# Patient Record
Sex: Male | Born: 1990
Health system: Southern US, Community
[De-identification: ages and names within clinical notes are randomized; demographics above are authoritative.]

## PROBLEM LIST (undated history)

## (undated) DIAGNOSIS — T1491XA Suicide attempt, initial encounter: Secondary | ICD-10-CM

## (undated) DIAGNOSIS — F209 Schizophrenia, unspecified: Secondary | ICD-10-CM

## (undated) HISTORY — PX: NO PAST SURGERIES: SHX2092

---
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
## 2017-06-17 DIAGNOSIS — R112 Nausea with vomiting, unspecified: Secondary | ICD-10-CM | POA: Diagnosis not present

## 2017-07-25 DIAGNOSIS — J00 Acute nasopharyngitis [common cold]: Secondary | ICD-10-CM | POA: Diagnosis not present

## 2018-01-09 DIAGNOSIS — Z7189 Other specified counseling: Secondary | ICD-10-CM | POA: Diagnosis not present

## 2018-02-24 ENCOUNTER — Other Ambulatory Visit: Payer: Self-pay

## 2018-02-24 ENCOUNTER — Emergency Department: Payer: Medicare Other

## 2018-02-24 ENCOUNTER — Inpatient Hospital Stay
Admission: EM | Admit: 2018-02-24 | Discharge: 2018-03-01 | DRG: 917 | Disposition: A | Discharge status: Other Acute Inpatient Hospital | Payer: Medicare Other | Attending: Internal Medicine | Admitting: Internal Medicine

## 2018-02-24 DIAGNOSIS — F209 Schizophrenia, unspecified: Secondary | ICD-10-CM | POA: Diagnosis present

## 2018-02-24 DIAGNOSIS — F17210 Nicotine dependence, cigarettes, uncomplicated: Secondary | ICD-10-CM | POA: Diagnosis present

## 2018-02-24 DIAGNOSIS — T434X2A Poisoning by butyrophenone and thiothixene neuroleptics, intentional self-harm, initial encounter: Principal | ICD-10-CM | POA: Diagnosis present

## 2018-02-24 DIAGNOSIS — W19XXXA Unspecified fall, initial encounter: Secondary | ICD-10-CM | POA: Diagnosis present

## 2018-02-24 DIAGNOSIS — F1510 Other stimulant abuse, uncomplicated: Secondary | ICD-10-CM | POA: Diagnosis present

## 2018-02-24 DIAGNOSIS — R05 Cough: Secondary | ICD-10-CM | POA: Diagnosis not present

## 2018-02-24 DIAGNOSIS — T50901A Poisoning by unspecified drugs, medicaments and biological substances, accidental (unintentional), initial encounter: Secondary | ICD-10-CM | POA: Diagnosis not present

## 2018-02-24 DIAGNOSIS — F329 Major depressive disorder, single episode, unspecified: Secondary | ICD-10-CM | POA: Diagnosis present

## 2018-02-24 DIAGNOSIS — E876 Hypokalemia: Secondary | ICD-10-CM | POA: Diagnosis present

## 2018-02-24 DIAGNOSIS — R9431 Abnormal electrocardiogram [ECG] [EKG]: Secondary | ICD-10-CM

## 2018-02-24 DIAGNOSIS — Z4682 Encounter for fitting and adjustment of non-vascular catheter: Secondary | ICD-10-CM | POA: Diagnosis not present

## 2018-02-24 DIAGNOSIS — Z79899 Other long term (current) drug therapy: Secondary | ICD-10-CM | POA: Diagnosis not present

## 2018-02-24 DIAGNOSIS — S025XXA Fracture of tooth (traumatic), initial encounter for closed fracture: Secondary | ICD-10-CM | POA: Diagnosis not present

## 2018-02-24 DIAGNOSIS — T1491XA Suicide attempt, initial encounter: Secondary | ICD-10-CM | POA: Diagnosis not present

## 2018-02-24 DIAGNOSIS — Y92009 Unspecified place in unspecified non-institutional (private) residence as the place of occurrence of the external cause: Secondary | ICD-10-CM | POA: Diagnosis not present

## 2018-02-24 DIAGNOSIS — J9601 Acute respiratory failure with hypoxia: Secondary | ICD-10-CM | POA: Diagnosis not present

## 2018-02-24 DIAGNOSIS — R0602 Shortness of breath: Secondary | ICD-10-CM | POA: Diagnosis not present

## 2018-02-24 DIAGNOSIS — T434X5A Adverse effect of butyrophenone and thiothixene neuroleptics, initial encounter: Secondary | ICD-10-CM | POA: Diagnosis present

## 2018-02-24 DIAGNOSIS — I10 Essential (primary) hypertension: Secondary | ICD-10-CM | POA: Diagnosis not present

## 2018-02-24 DIAGNOSIS — T50902A Poisoning by unspecified drugs, medicaments and biological substances, intentional self-harm, initial encounter: Secondary | ICD-10-CM | POA: Diagnosis not present

## 2018-02-24 DIAGNOSIS — I4581 Long QT syndrome: Secondary | ICD-10-CM | POA: Diagnosis present

## 2018-02-24 DIAGNOSIS — J209 Acute bronchitis, unspecified: Secondary | ICD-10-CM | POA: Diagnosis not present

## 2018-02-24 DIAGNOSIS — Z915 Personal history of self-harm: Secondary | ICD-10-CM | POA: Diagnosis not present

## 2018-02-24 DIAGNOSIS — T43592A Poisoning by other antipsychotics and neuroleptics, intentional self-harm, initial encounter: Secondary | ICD-10-CM | POA: Diagnosis present

## 2018-02-24 DIAGNOSIS — F200 Paranoid schizophrenia: Secondary | ICD-10-CM | POA: Diagnosis not present

## 2018-02-24 DIAGNOSIS — S199XXA Unspecified injury of neck, initial encounter: Secondary | ICD-10-CM | POA: Diagnosis not present

## 2018-02-24 DIAGNOSIS — R0600 Dyspnea, unspecified: Secondary | ICD-10-CM

## 2018-02-24 DIAGNOSIS — Z4659 Encounter for fitting and adjustment of other gastrointestinal appliance and device: Secondary | ICD-10-CM

## 2018-02-24 DIAGNOSIS — S0993XA Unspecified injury of face, initial encounter: Secondary | ICD-10-CM | POA: Diagnosis not present

## 2018-02-24 DIAGNOSIS — F1210 Cannabis abuse, uncomplicated: Secondary | ICD-10-CM | POA: Diagnosis present

## 2018-02-24 DIAGNOSIS — J690 Pneumonitis due to inhalation of food and vomit: Secondary | ICD-10-CM | POA: Diagnosis not present

## 2018-02-24 DIAGNOSIS — J9600 Acute respiratory failure, unspecified whether with hypoxia or hypercapnia: Secondary | ICD-10-CM

## 2018-02-24 DIAGNOSIS — J90 Pleural effusion, not elsewhere classified: Secondary | ICD-10-CM | POA: Diagnosis not present

## 2018-02-24 DIAGNOSIS — Z630 Problems in relationship with spouse or partner: Secondary | ICD-10-CM | POA: Diagnosis not present

## 2018-02-24 DIAGNOSIS — S0990XA Unspecified injury of head, initial encounter: Secondary | ICD-10-CM | POA: Diagnosis not present

## 2018-02-24 DIAGNOSIS — T50904A Poisoning by unspecified drugs, medicaments and biological substances, undetermined, initial encounter: Secondary | ICD-10-CM | POA: Diagnosis not present

## 2018-02-24 HISTORY — DX: Suicide attempt, initial encounter: T14.91XA

## 2018-02-24 LAB — COMPREHENSIVE METABOLIC PANEL
ALT: 16 U/L (ref 0–44)
AST: 17 U/L (ref 15–41)
Albumin: 4.7 g/dL (ref 3.5–5.0)
Alkaline Phosphatase: 56 U/L (ref 38–126)
Anion gap: 12 (ref 5–15)
BUN: 9 mg/dL (ref 6–20)
CO2: 24 mmol/L (ref 22–32)
Calcium: 9.3 mg/dL (ref 8.9–10.3)
Chloride: 101 mmol/L (ref 98–111)
Creatinine, Ser: 1.04 mg/dL (ref 0.61–1.24)
GFR calc Af Amer: >60 mL/min (ref >60)
GFR calc non Af Amer: >60 mL/min (ref >60)
Glucose, Bld: 187 mg/dL — ABNORMAL HIGH (ref 70–99)
Potassium: 3.2 mmol/L — ABNORMAL LOW (ref 3.5–5.1)
Sodium: 137 mmol/L (ref 135–145)
Total Bilirubin: 0.8 mg/dL (ref 0.3–1.2)
Total Protein: 7.7 g/dL (ref 6.5–8.1)

## 2018-02-24 LAB — CBC WITH DIFFERENTIAL/PLATELET
Basophils Absolute: 0 10*3/uL (ref 0–0.1)
Basophils Relative: 0 %
Eosinophils Absolute: 0.2 10*3/uL (ref 0–0.7)
Eosinophils Relative: 1 %
HCT: 45.8 % (ref 40.0–52.0)
Hemoglobin: 16.4 g/dL (ref 13.0–18.0)
Lymphocytes Relative: 9 %
Lymphs Abs: 1 10*3/uL (ref 1.0–3.6)
MCH: 31.9 pg (ref 26.0–34.0)
MCHC: 35.8 g/dL (ref 32.0–36.0)
MCV: 89 fL (ref 80.0–100.0)
Monocytes Absolute: 0.6 10*3/uL (ref 0.2–1.0)
Monocytes Relative: 5 %
Neutro Abs: 9.5 10*3/uL — ABNORMAL HIGH (ref 1.4–6.5)
Neutrophils Relative %: 85 %
Platelets: 267 10*3/uL (ref 150–440)
RBC: 5.15 MIL/uL (ref 4.40–5.90)
RDW: 13 % (ref 11.5–14.5)
WBC: 11.3 10*3/uL — ABNORMAL HIGH (ref 3.8–10.6)

## 2018-02-24 LAB — ETHANOL: Alcohol, Ethyl (B): <10 mg/dL (ref <10)

## 2018-02-24 LAB — ACETAMINOPHEN LEVEL: Acetaminophen (Tylenol), Serum: <10 ug/mL — ABNORMAL LOW (ref 10–30)

## 2018-02-24 LAB — TROPONIN I: Troponin I: <0.03 ng/mL (ref <0.03)

## 2018-02-24 LAB — LIPASE, BLOOD: Lipase: 20 U/L (ref 11–51)

## 2018-02-24 LAB — SALICYLATE LEVEL: Salicylate Lvl: <7 mg/dL (ref 2.8–30.0)

## 2018-02-24 MED ORDER — MAGNESIUM SULFATE 2 GM/50ML IV SOLN
2.0000 g | Freq: Once | INTRAVENOUS | Status: AC
  Administered 2018-02-25: 2 g via INTRAVENOUS
  Filled 2018-02-24: qty 50

## 2018-02-24 MED ORDER — KETAMINE HCL 10 MG/ML IJ SOLN
100.0000 mg | Freq: Once | INTRAMUSCULAR | Status: AC
  Administered 2018-02-25: 100 mg via INTRAVENOUS

## 2018-02-24 MED ORDER — ROCURONIUM BROMIDE 50 MG/5ML IV SOLN
100.0000 mg | Freq: Once | INTRAVENOUS | Status: AC
  Administered 2018-02-25: 100 mg via INTRAVENOUS
  Filled 2018-02-24: qty 10

## 2018-02-24 MED ORDER — KETAMINE HCL 50 MG/ML IJ SOLN
INTRAMUSCULAR | Status: AC
  Administered 2018-02-25: 100 mg
  Filled 2018-02-24: qty 10

## 2018-02-24 MED ORDER — KETAMINE HCL 10 MG/ML IJ SOLN: 100.0000 mg | Freq: Once | INTRAMUSCULAR | Status: DC

## 2018-02-24 MED ORDER — SODIUM CHLORIDE 0.9 % IV BOLUS
1000.0000 mL | Freq: Once | INTRAVENOUS | Status: AC
  Administered 2018-02-25: 1000 mL via INTRAVENOUS

## 2018-02-24 MED ORDER — MIDAZOLAM HCL 5 MG/5ML IJ SOLN
INTRAMUSCULAR | Status: AC
  Administered 2018-02-24: 2 mg
  Filled 2018-02-24: qty 5

## 2018-02-24 MED ORDER — DEXMEDETOMIDINE HCL IN NACL 400 MCG/100ML IV SOLN
0.4000 ug/kg/h | INTRAVENOUS | Status: DC
  Administered 2018-02-25: 0.4 ug/kg/h via INTRAVENOUS
  Filled 2018-02-24: qty 100

## 2018-02-24 MED ORDER — FENTANYL 2500MCG IN NS 250ML (10MCG/ML) PREMIX INFUSION
150.0000 ug/h | INTRAVENOUS | Status: DC
  Administered 2018-02-25: 150 ug/h via INTRAVENOUS
  Filled 2018-02-24: qty 250

## 2018-02-24 NOTE — ED Notes (Addendum)
Attempted to help pt get straightened out in the bed, explained to pt that he was sitting straight up in the bed, pt proceeds to call this NT a "stupid bitch" and say "god damn I just want to straighten up".

## 2018-02-24 NOTE — ED Provider Notes (Signed)
[facility] Emergency Department Provider Note  ____________________________________________   First MD Initiated Contact with Patient 02/24/18 2304     (approximate)  I have reviewed the triage vital signs and the nursing notes.   HISTORY  Chief Complaint Ingestion  Level 5 caveat:  history/ROS limited by active psychosis / mental illness / altered mental status   HPI Phu Record is a 27 y.o. male who is both somnolent and belligerent and minimally cooperative with the history.  He arrives by EMS after reportedly taking an overdose of at least 20 tablets of haloperidol and 30 tablets of Abilify.  He says that he wants to die.  After taking the overdose he allegedly fell when he was outside and struck his face on the ground or concrete and he has at least one broken tooth and some dried blood in his mouth.  He will not answer questions about headache or neck pain.  He reports that he needs to have a bowel movement and keeps yelling and screaming at everyone that he needs "to take a shit" in between episodes of falling asleep.  No additional history is available at this time other than the patient's persistent suicidality and the fact that he says he attempted to kill himself several years ago and spent time in a psychiatric facility.  Past Medical History:  Diagnosis Date  . Suicide attempt (HCC)     There are no active problems to display for this patient.   History reviewed. No pertinent surgical history.  Prior to Admission medications   Medication Sig Start Date End Date Taking? Authorizing Provider  ARIPiprazole (ABILIFY) 15 MG tablet Take 15 mg by mouth daily. 02/14/18  Yes [provider]  divalproex (DEPAKOTE) 500 MG DR tablet Take 500 mg by mouth 2 (two) times daily. 02/14/18  Yes [provider]  doxepin (SINEQUAN) 50 MG capsule Take 50 mg by mouth at bedtime.  02/11/18  Yes [provider]  haloperidol (HALDOL) 10 MG  tablet Take 10 mg by mouth 2 (two) times daily. 02/14/18  Yes [provider]  hydrOXYzine (VISTARIL) 25 MG capsule Take 25 mg by mouth every 8 (eight) hours as needed.  02/14/18  Yes [provider]    Allergies Patient has no known allergies.  History reviewed. No pertinent family history.  Social History Social History   Tobacco Use  . Smoking status: Current Every Day Smoker    Packs/day: 1.00  . Smokeless tobacco: Current User  Substance Use Topics  . Alcohol use: Not Currently  . Drug use: Yes    Types: Methamphetamines    Review of Systems Level 5 caveat:  history/ROS limited by active psychosis / mental illness / altered mental status  ____________________________________________   PHYSICAL EXAM:  VITAL SIGNS: ED Triage Vitals  Enc Vitals Group     BP 02/24/18 2222 (!) 144/90     Pulse Rate 02/24/18 2222 77     Resp 02/24/18 2222 16     Temp 02/24/18 2222 (!) 97.5 F (36.4 C)     Temp Source 02/24/18 2222 Oral     SpO2 02/24/18 2222 94 %     Weight 02/24/18 2223 106.6 kg (235 lb)     Height 02/24/18 2223 1.88 m (6\' 2" )     Head Circumference --      Peak Flow --      Pain Score 02/24/18 2223 0     Pain Loc --  Pain Edu? --      Excl. in GC? --     Constitutional: Intermittently somnolent and belligerent Eyes: Conjunctivae are normal.  Pupils are equal but sluggish Head: Atraumatic except for mouth as described below Ears: No hemotympanum Nose: Dried epistaxis in both nares Mouth/Throat: Mucous membranes are moist.  There is some dental trauma but no laceration that requires suturing; he has a fractured tooth #9 but no other teeth feel loose at this time. Neck: No stridor.  No meningeal signs.  Patient is uncooperative with physical exam so I do not know if he has any cervical spine tenderness Cardiovascular: Normal rate, regular rhythm. Good peripheral circulation. Grossly normal heart sounds. Respiratory: Normal respiratory  effort.  No retractions. Lungs CTAB.  Frequent thick sounding cough. Gastrointestinal: Soft and nontender. No distention.  Musculoskeletal: No lower extremity tenderness nor edema. No gross deformities of extremities. Neurologic:  Normal speech and language. No gross focal neurologic deficits are appreciated.  Skin:  Skin is warm, dry and intact. No rash noted. Psychiatric: Mood and affect are alternating between somnolent, belligerent, but throughout he continues to endorse suicidal ideation and states that the overdose was an attempt to kill himself.  ____________________________________________   LABS (all labs ordered are listed, but only abnormal results are displayed)  Labs Reviewed  ACETAMINOPHEN LEVEL - Abnormal; Notable for the following components:      Result Value   Acetaminophen (Tylenol), Serum <10 (*)    All other components within normal limits  COMPREHENSIVE METABOLIC PANEL - Abnormal; Notable for the following components:   Potassium 3.2 (*)    Glucose, Bld 187 (*)    All other components within normal limits  CBC WITH DIFFERENTIAL/PLATELET - Abnormal; Notable for the following components:   WBC 11.3 (*)    Neutro Abs 9.5 (*)    All other components within normal limits  URINE DRUG SCREEN, QUALITATIVE ([facility] ONLY) - Abnormal; Notable for the following components:   Tricyclic, Ur Screen POSITIVE (*)    Amphetamines, Ur Screen POSITIVE (*)    MDMA (Ecstasy)Ur Screen POSITIVE (*)    Cannabinoid 50 Ng, Ur [place] POSITIVE (*)    Benzodiazepine, Ur Scrn POSITIVE (*)    All other components within normal limits  URINALYSIS, COMPLETE (UACMP) WITH MICROSCOPIC - Abnormal; Notable for the following components:   Color, Urine YELLOW (*)    APPearance HAZY (*)    Ketones, ur 20 (*)    Bacteria, UA RARE (*)    All other components within normal limits  BLOOD GAS, ARTERIAL - Abnormal; Notable for the following components:   pH, Arterial 7.32 (*)    pO2, Arterial 74 (*)     Acid-base deficit 4.4 (*)    All other components within normal limits  VALPROIC ACID LEVEL - Abnormal; Notable for the following components:   Valproic Acid Lvl <10 (*)    All other components within normal limits  GLUCOSE, CAPILLARY - Abnormal; Notable for the following components:   Glucose-Capillary 123 (*)    All other components within normal limits  CBC - Abnormal; Notable for the following components:   RBC 4.25 (*)    HCT 37.8 (*)    All other components within normal limits  BASIC METABOLIC PANEL - Abnormal; Notable for the following components:   Glucose, Bld 126 (*)    Calcium 7.8 (*)    Anion gap 4 (*)    All other components within normal limits  BLOOD GAS, ARTERIAL - Abnormal; Notable  for the following components:   pH, Arterial 7.32 (*)    pO2, Arterial 198 (*)    Acid-base deficit 2.3 (*)    All other components within normal limits  MRSA PCR SCREENING  ETHANOL  LIPASE, BLOOD  SALICYLATE LEVEL  TROPONIN I  MAGNESIUM  TRIGLYCERIDES  MAGNESIUM  GLUCOSE, CAPILLARY  HIV ANTIBODY (ROUTINE TESTING)  PHOSPHORUS  CBG MONITORING, ED   ____________________________________________  EKG  ED ECG REPORT I, Loleta Rose, the attending physician, personally viewed and interpreted this ECG.  Date: 02/24/2018 EKG Time: 23:25 Rate: 84 Rhythm: normal sinus rhythm QRS Axis: normal Intervals: RBBB.  Concerning for QTc prolongation at 562 ms ST/T Wave abnormalities: Non-specific ST segment / T-wave changes, but no evidence of acute ischemia. Narrative Interpretation: no evidence of acute ischemia   ____________________________________________  RADIOLOGY I, Loleta Rose, personally viewed and evaluated these images (plain radiographs) as part of my medical decision making, as well as reviewing the written report by the radiologist.  ED MD interpretation: No active disease process on first chest x-ray.  Second chest x-ray indicates appropriate placement of ET tube.   No acute abnormalities on CT scans of the face, head, and cervical spine.  Official radiology report(s): Ct Head Wo Contrast  Result Date: 02/25/2018 CLINICAL DATA:  Head trauma, intracranial venous injury suspected Altered level of consciousness (LOC), unexplained; Facial trauma, fx suspected took overdose then fell and hit face on concrete; C-spine fx, traumatic. EXAM: CT HEAD WITHOUT CONTRAST CT MAXILLOFACIAL WITHOUT CONTRAST CT CERVICAL SPINE WITHOUT CONTRAST TECHNIQUE: Multidetector CT imaging of the head, cervical spine, and maxillofacial structures were performed using the standard protocol without intravenous contrast. Multiplanar CT image reconstructions of the cervical spine and maxillofacial structures were also generated. COMPARISON:  None. FINDINGS: CT HEAD FINDINGS Brain: No evidence of cerebral edema. No intracranial hemorrhage, mass effect, or midline shift. No hydrocephalus. The basilar cisterns are patent. No evidence of territorial infarct or acute ischemia. No extra-axial or intracranial fluid collection. Vascular: No hyperdense vessel or unexpected calcification. Skull: No fracture or focal lesion. Other: None. CT MAXILLOFACIAL FINDINGS Osseous: Nasal bone, zygomatic arches, and mandibles are intact. The temporomandibular joints are congruent. Dental caries with periapical lucency about left upper second molar. Orbits: Both orbits and globes are intact.  No orbital fracture. Sinuses: Mucosal thickening throughout the paranasal sinuses with opacification of ethmoid air cells. No sinus fluid levels or fracture. Enteric and endotracheal tubes in place. Soft tissues: Negative. CT CERVICAL SPINE FINDINGS Alignment: Slight left head tilt. Otherwise normal without traumatic subluxation. Skull base and vertebrae: No acute fracture. Vertebral body heights are maintained. The dens and skull base are intact. Soft tissues and spinal canal: No prevertebral fluid or swelling. No visible canal hematoma.  Disc levels:  Normal. Upper chest: Negative. Other: None. IMPRESSION: 1. Negative head CT. 2. No facial bone fracture. Mucosal thickening of the paranasal sinuses may be secondary to intubation. Dental caries with periapical lucency of left upper second molar. 3. Negative CT of the cervical spine. Electronically Signed   By: Narda Rutherford M.D.   On: 02/25/2018 02:23   Ct Cervical Spine Wo Contrast  Result Date: 02/25/2018 CLINICAL DATA:  Head trauma, intracranial venous injury suspected Altered level of consciousness (LOC), unexplained; Facial trauma, fx suspected took overdose then fell and hit face on concrete; C-spine fx, traumatic. EXAM: CT HEAD WITHOUT CONTRAST CT MAXILLOFACIAL WITHOUT CONTRAST CT CERVICAL SPINE WITHOUT CONTRAST TECHNIQUE: Multidetector CT imaging of the head, cervical spine, and maxillofacial structures were performed  using the standard protocol without intravenous contrast. Multiplanar CT image reconstructions of the cervical spine and maxillofacial structures were also generated. COMPARISON:  None. FINDINGS: CT HEAD FINDINGS Brain: No evidence of cerebral edema. No intracranial hemorrhage, mass effect, or midline shift. No hydrocephalus. The basilar cisterns are patent. No evidence of territorial infarct or acute ischemia. No extra-axial or intracranial fluid collection. Vascular: No hyperdense vessel or unexpected calcification. Skull: No fracture or focal lesion. Other: None. CT MAXILLOFACIAL FINDINGS Osseous: Nasal bone, zygomatic arches, and mandibles are intact. The temporomandibular joints are congruent. Dental caries with periapical lucency about left upper second molar. Orbits: Both orbits and globes are intact.  No orbital fracture. Sinuses: Mucosal thickening throughout the paranasal sinuses with opacification of ethmoid air cells. No sinus fluid levels or fracture. Enteric and endotracheal tubes in place. Soft tissues: Negative. CT CERVICAL SPINE FINDINGS Alignment: Slight  left head tilt. Otherwise normal without traumatic subluxation. Skull base and vertebrae: No acute fracture. Vertebral body heights are maintained. The dens and skull base are intact. Soft tissues and spinal canal: No prevertebral fluid or swelling. No visible canal hematoma. Disc levels:  Normal. Upper chest: Negative. Other: None. IMPRESSION: 1. Negative head CT. 2. No facial bone fracture. Mucosal thickening of the paranasal sinuses may be secondary to intubation. Dental caries with periapical lucency of left upper second molar. 3. Negative CT of the cervical spine. Electronically Signed   By: Narda Rutherford M.D.   On: 02/25/2018 02:23   Dg Chest Portable 1 View  Result Date: 02/25/2018 CLINICAL DATA:  27 y/o  M; status post intubation. EXAM: PORTABLE CHEST 1 VIEW COMPARISON:  02/24/2018 chest radiograph FINDINGS: Stable normal cardiac silhouette given projection and technique. Endotracheal tube tip projects 3.1 cm above the carina. Enteric tube tip extends below the field of view into the abdomen. Clear lungs. No pleural effusion or pneumothorax. No acute osseous abnormality is evident. IMPRESSION: 1. Endotracheal tube tip projects 3.1 cm above the carina. Enteric tube tip is below field of view in the abdomen. 2. No acute pulmonary process identified. Electronically Signed   By: Mitzi Hansen M.D.   On: 02/25/2018 01:01   Dg Chest Portable 1 View  Result Date: 02/25/2018 CLINICAL DATA:  Cough and short of breath overdose EXAM: PORTABLE CHEST 1 VIEW COMPARISON:  None. FINDINGS: Borderline heart size. No acute opacity or pleural effusion. No pneumothorax. IMPRESSION: No active disease. Electronically Signed   By: Jasmine Pang M.D.   On: 02/25/2018 00:05   Ct Maxillofacial Wo Contrast  Result Date: 02/25/2018 CLINICAL DATA:  Head trauma, intracranial venous injury suspected Altered level of consciousness (LOC), unexplained; Facial trauma, fx suspected took overdose then fell and hit face  on concrete; C-spine fx, traumatic. EXAM: CT HEAD WITHOUT CONTRAST CT MAXILLOFACIAL WITHOUT CONTRAST CT CERVICAL SPINE WITHOUT CONTRAST TECHNIQUE: Multidetector CT imaging of the head, cervical spine, and maxillofacial structures were performed using the standard protocol without intravenous contrast. Multiplanar CT image reconstructions of the cervical spine and maxillofacial structures were also generated. COMPARISON:  None. FINDINGS: CT HEAD FINDINGS Brain: No evidence of cerebral edema. No intracranial hemorrhage, mass effect, or midline shift. No hydrocephalus. The basilar cisterns are patent. No evidence of territorial infarct or acute ischemia. No extra-axial or intracranial fluid collection. Vascular: No hyperdense vessel or unexpected calcification. Skull: No fracture or focal lesion. Other: None. CT MAXILLOFACIAL FINDINGS Osseous: Nasal bone, zygomatic arches, and mandibles are intact. The temporomandibular joints are congruent. Dental caries with periapical lucency about left upper  second molar. Orbits: Both orbits and globes are intact.  No orbital fracture. Sinuses: Mucosal thickening throughout the paranasal sinuses with opacification of ethmoid air cells. No sinus fluid levels or fracture. Enteric and endotracheal tubes in place. Soft tissues: Negative. CT CERVICAL SPINE FINDINGS Alignment: Slight left head tilt. Otherwise normal without traumatic subluxation. Skull base and vertebrae: No acute fracture. Vertebral body heights are maintained. The dens and skull base are intact. Soft tissues and spinal canal: No prevertebral fluid or swelling. No visible canal hematoma. Disc levels:  Normal. Upper chest: Negative. Other: None. IMPRESSION: 1. Negative head CT. 2. No facial bone fracture. Mucosal thickening of the paranasal sinuses may be secondary to intubation. Dental caries with periapical lucency of left upper second molar. 3. Negative CT of the cervical spine. Electronically Signed   By: Narda Rutherford M.D.   On: 02/25/2018 02:23    ____________________________________________   PROCEDURES  Critical Care performed: Yes, see critical care procedure note(s)   Procedure(s) performed:   .Critical Care Performed by: Loleta Rose, MD Authorized by: Loleta Rose, MD   Critical care provider statement:    Critical care time (minutes):  60   Critical care time was exclusive of:  Separately billable procedures and treating other patients   Critical care was necessary to treat or prevent imminent or life-threatening deterioration of the following conditions:  Toxidrome and respiratory failure (suicide attempt)   Critical care was time spent personally by me on the following activities:  Development of treatment plan with patient or surrogate, discussions with consultants, evaluation of patient's response to treatment, examination of patient, obtaining history from patient or surrogate, ordering and performing treatments and interventions, ordering and review of laboratory studies, ordering and review of radiographic studies, pulse oximetry, re-evaluation of patient's condition and review of old charts Procedure Name: Intubation Date/Time: 02/25/2018 12:14 AM Performed by: Loleta Rose, MD Pre-anesthesia Checklist: Patient identified, Emergency Drugs available, Suction available and Patient being monitored Preoxygenation: Pre-oxygenation with 100% oxygen Induction Type: IV induction and Rapid sequence Laryngoscope Size: Glidescope Tube size: 7.5 mm Number of attempts: 1 Placement Confirmation: ETT inserted through vocal cords under direct vision,  CO2 detector and Breath sounds checked- equal and bilateral Secured at: 23 cm Tube secured with: ETT holder Dental Injury: Teeth and Oropharynx as per pre-operative assessment  Comments: Patient already had partially avulsed tooth #9 with some dried blood in mouth prior to intubation.  Procedure was atraumatic, accomplished easily and  without excessive manipulation, and no other damage/injury was sustained during the intubation that was not pre-existing.        ____________________________________________   INITIAL IMPRESSION / ASSESSMENT AND PLAN / ED COURSE  As part of my medical decision making, I reviewed the following data within the electronic MEDICAL RECORD NUMBER Nursing notes reviewed and incorporated, Labs reviewed , EKG interpreted , Old chart reviewed, Radiograph reviewed  and Discussed with admitting physician (Dr. Anne Hahn)    Differential diagnosis includes, but is not limited to, depression, suicidal ideation, mood disorder, adjustment disorder, intoxication.  The sequela of his particular overdose includes QTC prolongation, CNS depression, potentially fatal cardiac arrhythmias, etc.  The patient required a great deal of care for at least an hour after my arrival to my shift.  As described above, he alternated from minute to minute if not second the second between belligerent and agitated, ripping out his IV, and trying to leave, with being somnolent while vomiting.  I attempted to different calming agents including Versed 2 mg  IV which had no apparent effect as well as ketamine 1 mg IV also has a calming agent through which she was monitored on a cardiac monitor and SPO2.  Within minutes he was again awake and attempting to pull out his IV.  In the meantime his chest x-ray was clear but his EKG demonstrates a QTC of greater than 560 ms and I have no old EKG against which to compare.  This is very concerning in the setting of an overdose of both Abilify and Haldol.  Additionally his labs came back showing a potassium of 3 which can worsen his potential for cardiac arrhythmias.  I have ordered potassium infusion as well as magnesium 2 g IV to treat the overdose and EKG changes.  For his altered mental status, concern for loss of airway, and danger posed to both the patient and staff (at one point he drew back his  fist as of to punch me when I was attempting to verbally de-escalate him), I made the decision to intubate using ketamine 100 mg IV, rocuronium 100 mg IV, and then started him on a Precedex infusion and fentanyl infusion (150 mcg/h).  He is also receiving a 1 L fluid bolus.  I discussed the case with the hospitalist, Dr. Anne Hahn, who will admit to the ICU for further management.  Of note I did place the patient under involuntary commitment due to his persistent suicidal ideation and he will need to be seen by psychiatry after he proves he is able to protect his airway.   Clinical Course as of Feb 26 800  Tue Feb 25, 2018  0118 Patient increasingly hypoxemic.  Increased FiO2 to 100% and PEEP from 5-8 given the concern for possible ARDS.   [CF]  0118 Urine drug screen is positive for multiple substances; we provided benzodiazepines and ketamine so I do not know for sure that these were positive previously, but he also tested positive for tricyclics, MDMA, and cannabinoids.  Urine Drug Screen, Qualitative(!) [CF]  8242 No acute abnormalities on CTs (head, maxillofacial, and C-spine)   [CF]    Clinical Course User Index [CF] Loleta Rose, MD    ____________________________________________  FINAL CLINICAL IMPRESSION(S) / ED DIAGNOSES  Final diagnoses:  Haloperidol overdose, intentional self-harm, initial encounter (HCC)  Suicide attempt (HCC)  Acute respiratory failure, unspecified whether with hypoxia or hypercapnia (HCC)  Prolonged Q-T interval on ECG  Hypokalemia     MEDICATIONS GIVEN DURING THIS VISIT:  Medications  magnesium sulfate IVPB 2 g 50 mL (2 g Intravenous New Bag/Given 02/25/18 0012)  dexmedetomidine (PRECEDEX) 400 MCG/100ML (4 mcg/mL) infusion (has no administration in time range)  sodium chloride 0.9 % bolus 1,000 mL (1,000 mLs Intravenous New Bag/Given 02/25/18 0025)  fentaNYL in NS (30mcg/ml) infusion-PREMIX (has no administration in time range)    potassium chloride 10 mEq in 100 mL IVPB (has no administration in time range)  midazolam (VERSED) 5 MG/5ML injection (2 mg  Given 02/24/18 2309)  ketamine (KETALAR) injection 100 mg (100 mg Intravenous Given by Other 02/25/18 0007)  ketamine (KETALAR) 50 MG/ML injection (100 mg  Given 02/25/18 0007)  rocuronium (ZEMURON) injection 100 mg (100 mg Intravenous Given 02/25/18 0009)     ED Discharge Orders    None       Note:  This document was prepared using Dragon voice recognition software and may include unintentional dictation errors.    Loleta Rose, MD 02/25/18 [phone]

## 2018-02-24 NOTE — ED Notes (Signed)
Pt reports pain to the whole body. Pt is argumentative. Pt wants the covers placed over his shoulders. Encourgaed pt to do it himself. Pt states he is unable to feel anything.

## 2018-02-24 NOTE — ED Triage Notes (Signed)
Pt to the er for an intentional overdose. Per EMS pt took 30 Haldol and 20 Abilify. Vital signs stable enroute. Pt not cooperative on truck. Pt nauseated and vomiting at this time. Pt fell and did knock out a tooth prior to EMS arrival.

## 2018-02-25 ENCOUNTER — Inpatient Hospital Stay: Payer: Medicare Other

## 2018-02-25 ENCOUNTER — Emergency Department: Payer: Medicare Other

## 2018-02-25 ENCOUNTER — Encounter: Payer: Self-pay | Admitting: Internal Medicine

## 2018-02-25 DIAGNOSIS — S025XXA Fracture of tooth (traumatic), initial encounter for closed fracture: Secondary | ICD-10-CM | POA: Diagnosis present

## 2018-02-25 DIAGNOSIS — T50901A Poisoning by unspecified drugs, medicaments and biological substances, accidental (unintentional), initial encounter: Secondary | ICD-10-CM

## 2018-02-25 DIAGNOSIS — J9601 Acute respiratory failure with hypoxia: Secondary | ICD-10-CM | POA: Diagnosis present

## 2018-02-25 DIAGNOSIS — T43592A Poisoning by other antipsychotics and neuroleptics, intentional self-harm, initial encounter: Secondary | ICD-10-CM | POA: Diagnosis present

## 2018-02-25 DIAGNOSIS — T434X5A Adverse effect of butyrophenone and thiothixene neuroleptics, initial encounter: Secondary | ICD-10-CM | POA: Diagnosis present

## 2018-02-25 DIAGNOSIS — Z915 Personal history of self-harm: Secondary | ICD-10-CM | POA: Diagnosis not present

## 2018-02-25 DIAGNOSIS — T434X2A Poisoning by butyrophenone and thiothixene neuroleptics, intentional self-harm, initial encounter: Secondary | ICD-10-CM | POA: Diagnosis present

## 2018-02-25 DIAGNOSIS — F329 Major depressive disorder, single episode, unspecified: Secondary | ICD-10-CM | POA: Diagnosis present

## 2018-02-25 DIAGNOSIS — F209 Schizophrenia, unspecified: Secondary | ICD-10-CM | POA: Diagnosis present

## 2018-02-25 DIAGNOSIS — W19XXXA Unspecified fall, initial encounter: Secondary | ICD-10-CM | POA: Diagnosis present

## 2018-02-25 DIAGNOSIS — F1210 Cannabis abuse, uncomplicated: Secondary | ICD-10-CM | POA: Diagnosis present

## 2018-02-25 DIAGNOSIS — T50902A Poisoning by unspecified drugs, medicaments and biological substances, intentional self-harm, initial encounter: Secondary | ICD-10-CM | POA: Diagnosis not present

## 2018-02-25 DIAGNOSIS — Z630 Problems in relationship with spouse or partner: Secondary | ICD-10-CM | POA: Diagnosis not present

## 2018-02-25 DIAGNOSIS — I10 Essential (primary) hypertension: Secondary | ICD-10-CM | POA: Diagnosis not present

## 2018-02-25 DIAGNOSIS — F17210 Nicotine dependence, cigarettes, uncomplicated: Secondary | ICD-10-CM | POA: Diagnosis present

## 2018-02-25 DIAGNOSIS — Y92009 Unspecified place in unspecified non-institutional (private) residence as the place of occurrence of the external cause: Secondary | ICD-10-CM | POA: Diagnosis not present

## 2018-02-25 DIAGNOSIS — Z79899 Other long term (current) drug therapy: Secondary | ICD-10-CM | POA: Diagnosis not present

## 2018-02-25 DIAGNOSIS — J690 Pneumonitis due to inhalation of food and vomit: Secondary | ICD-10-CM | POA: Diagnosis not present

## 2018-02-25 DIAGNOSIS — J209 Acute bronchitis, unspecified: Secondary | ICD-10-CM | POA: Diagnosis present

## 2018-02-25 DIAGNOSIS — E876 Hypokalemia: Secondary | ICD-10-CM | POA: Diagnosis present

## 2018-02-25 DIAGNOSIS — F200 Paranoid schizophrenia: Secondary | ICD-10-CM | POA: Diagnosis not present

## 2018-02-25 DIAGNOSIS — F1510 Other stimulant abuse, uncomplicated: Secondary | ICD-10-CM | POA: Diagnosis present

## 2018-02-25 DIAGNOSIS — T1491XA Suicide attempt, initial encounter: Secondary | ICD-10-CM

## 2018-02-25 DIAGNOSIS — I4581 Long QT syndrome: Secondary | ICD-10-CM | POA: Diagnosis present

## 2018-02-25 LAB — POTASSIUM
Potassium: 5 mmol/L (ref 3.5–5.1)
Potassium: 4.6 mmol/L (ref 3.5–5.1)

## 2018-02-25 LAB — BASIC METABOLIC PANEL
Anion gap: 4 — ABNORMAL LOW (ref 5–15)
BUN: 7 mg/dL (ref 6–20)
CO2: 24 mmol/L (ref 22–32)
Calcium: 7.8 mg/dL — ABNORMAL LOW (ref 8.9–10.3)
Chloride: 108 mmol/L (ref 98–111)
Creatinine, Ser: 0.76 mg/dL (ref 0.61–1.24)
GFR calc Af Amer: >60 mL/min (ref >60)
GFR calc non Af Amer: >60 mL/min (ref >60)
Glucose, Bld: 126 mg/dL — ABNORMAL HIGH (ref 70–99)
Potassium: 4.2 mmol/L (ref 3.5–5.1)
Sodium: 136 mmol/L (ref 135–145)

## 2018-02-25 LAB — GLUCOSE, CAPILLARY
Glucose-Capillary: 123 mg/dL — ABNORMAL HIGH (ref 70–99)
Glucose-Capillary: 86 mg/dL (ref 70–99)
Glucose-Capillary: 111 mg/dL — ABNORMAL HIGH (ref 70–99)
Glucose-Capillary: 66 mg/dL — ABNORMAL LOW (ref 70–99)
Glucose-Capillary: 99 mg/dL (ref 70–99)
Glucose-Capillary: 157 mg/dL — ABNORMAL HIGH (ref 70–99)

## 2018-02-25 LAB — URINE DRUG SCREEN, QUALITATIVE (ARMC ONLY)
Amphetamines, Ur Screen: POSITIVE — AB
Barbiturates, Ur Screen: NOT DETECTED
Benzodiazepine, Ur Scrn: POSITIVE — AB
Cannabinoid 50 Ng, Ur ~~LOC~~: POSITIVE — AB
Cocaine Metabolite,Ur ~~LOC~~: NOT DETECTED
MDMA (Ecstasy)Ur Screen: POSITIVE — AB
Methadone Scn, Ur: NOT DETECTED
Opiate, Ur Screen: NOT DETECTED
Phencyclidine (PCP) Ur S: NOT DETECTED
Tricyclic, Ur Screen: POSITIVE — AB

## 2018-02-25 LAB — BLOOD GAS, ARTERIAL
Acid-base deficit: 2.3 mmol/L — ABNORMAL HIGH (ref 0.0–2.0)
Acid-base deficit: 4.4 mmol/L — ABNORMAL HIGH (ref 0.0–2.0)
Bicarbonate: 21.6 mmol/L (ref 20.0–28.0)
Bicarbonate: 24.2 mmol/L (ref 20.0–28.0)
FIO2: 0.4
FIO2: 1
MECHVT: 500 mL
MECHVT: 500 mL
Mechanical Rate: 12
Mechanical Rate: 20
O2 Saturation: 93.3 %
O2 Saturation: 99.6 %
PEEP: 10 cmH2O
PEEP: 5 cmH2O
Patient temperature: 37
Patient temperature: 37
pCO2 arterial: 42 mmHg (ref 32.0–48.0)
pCO2 arterial: 47 mmHg (ref 32.0–48.0)
pH, Arterial: 7.32 — ABNORMAL LOW (ref 7.350–7.450)
pH, Arterial: 7.32 — ABNORMAL LOW (ref 7.350–7.450)
pO2, Arterial: 198 mmHg — ABNORMAL HIGH (ref 83.0–108.0)
pO2, Arterial: 74 mmHg — ABNORMAL LOW (ref 83.0–108.0)

## 2018-02-25 LAB — CBC
HCT: 37.8 % — ABNORMAL LOW (ref 40.0–52.0)
Hemoglobin: 13.6 g/dL (ref 13.0–18.0)
MCH: 32 pg (ref 26.0–34.0)
MCHC: 36 g/dL (ref 32.0–36.0)
MCV: 89.1 fL (ref 80.0–100.0)
Platelets: 238 10*3/uL (ref 150–440)
RBC: 4.25 MIL/uL — ABNORMAL LOW (ref 4.40–5.90)
RDW: 12.8 % (ref 11.5–14.5)
WBC: 8 10*3/uL (ref 3.8–10.6)

## 2018-02-25 LAB — URINALYSIS, COMPLETE (UACMP) WITH MICROSCOPIC
Bilirubin Urine: NEGATIVE
Glucose, UA: NEGATIVE mg/dL
Hgb urine dipstick: NEGATIVE
Ketones, ur: 20 mg/dL — AB
Leukocytes, UA: NEGATIVE
Nitrite: NEGATIVE
Protein, ur: NEGATIVE mg/dL
Specific Gravity, Urine: 1.013 (ref 1.005–1.030)
Squamous Epithelial / LPF: NONE SEEN (ref 0–5)
pH: 6 (ref 5.0–8.0)

## 2018-02-25 LAB — VALPROIC ACID LEVEL: Valproic Acid Lvl: <10 ug/mL — ABNORMAL LOW (ref 50.0–100.0)

## 2018-02-25 LAB — MAGNESIUM
Magnesium: 1.8 mg/dL (ref 1.7–2.4)
Magnesium: 2 mg/dL (ref 1.7–2.4)

## 2018-02-25 LAB — PHOSPHORUS: Phosphorus: 4 mg/dL (ref 2.5–4.6)

## 2018-02-25 LAB — TRIGLYCERIDES: Triglycerides: 93 mg/dL (ref <150)

## 2018-02-25 LAB — MRSA PCR SCREENING: MRSA by PCR: NEGATIVE

## 2018-02-25 MED ORDER — FENTANYL 2500MCG IN NS 250ML (10MCG/ML) PREMIX INFUSION
25.0000 ug/h | INTRAVENOUS | Status: DC
  Administered 2018-02-25: 250 ug/h via INTRAVENOUS
  Administered 2018-02-25 – 2018-02-26 (×2): 350 ug/h via INTRAVENOUS
  Filled 2018-02-25 (×3): qty 250

## 2018-02-25 MED ORDER — POTASSIUM CHLORIDE 10 MEQ/100ML IV SOLN
10.0000 meq | INTRAVENOUS | Status: DC
  Administered 2018-02-25 (×4): 10 meq via INTRAVENOUS
  Filled 2018-02-25 (×7): qty 100

## 2018-02-25 MED ORDER — INSULIN ASPART 100 UNIT/ML ~~LOC~~ SOLN
0.0000 [IU] | SUBCUTANEOUS | Status: DC
  Administered 2018-02-25: 3 [IU] via SUBCUTANEOUS
  Administered 2018-02-25 – 2018-02-26 (×2): 2 [IU] via SUBCUTANEOUS
  Filled 2018-02-25 (×4): qty 1

## 2018-02-25 MED ORDER — VITAL HIGH PROTEIN PO LIQD
1000.0000 mL | ORAL | Status: DC
  Administered 2018-02-25: 1000 mL

## 2018-02-25 MED ORDER — ASPIRIN 300 MG RE SUPP
300.0000 mg | RECTAL | Status: AC
  Administered 2018-02-25: 300 mg via RECTAL
  Filled 2018-02-25: qty 1

## 2018-02-25 MED ORDER — PROPOFOL 1000 MG/100ML IV EMUL
5.0000 ug/kg/min | INTRAVENOUS | Status: DC
  Administered 2018-02-25: 35 ug/kg/min via INTRAVENOUS
  Administered 2018-02-25: 15 ug/kg/min via INTRAVENOUS
  Administered 2018-02-25 – 2018-02-26 (×2): 35 ug/kg/min via INTRAVENOUS
  Administered 2018-02-26: 40 ug/kg/min via INTRAVENOUS
  Filled 2018-02-25 (×6): qty 100

## 2018-02-25 MED ORDER — DEXTROSE-NACL 5-0.45 % IV SOLN
INTRAVENOUS | Status: DC
  Administered 2018-02-25 – 2018-02-26 (×2): via INTRAVENOUS

## 2018-02-25 MED ORDER — METHYLPREDNISOLONE SODIUM SUCC 125 MG IJ SOLR
60.0000 mg | Freq: Four times a day (QID) | INTRAMUSCULAR | Status: DC
  Administered 2018-02-25 – 2018-02-26 (×5): 60 mg via INTRAVENOUS
  Filled 2018-02-25 (×5): qty 2

## 2018-02-25 MED ORDER — POTASSIUM CHLORIDE 10 MEQ/100ML IV SOLN
10.0000 meq | INTRAVENOUS | Status: DC
  Filled 2018-02-25 (×2): qty 100

## 2018-02-25 MED ORDER — ENOXAPARIN SODIUM 40 MG/0.4ML ~~LOC~~ SOLN
40.0000 mg | SUBCUTANEOUS | Status: DC
  Administered 2018-02-25: 40 mg via SUBCUTANEOUS
  Filled 2018-02-25: qty 0.4

## 2018-02-25 MED ORDER — ADULT MULTIVITAMIN LIQUID CH
15.0000 mL | Freq: Every day | ORAL | Status: DC
  Administered 2018-02-25: 15 mL
  Filled 2018-02-25 (×2): qty 15

## 2018-02-25 MED ORDER — MIDAZOLAM HCL 2 MG/2ML IJ SOLN
2.0000 mg | INTRAMUSCULAR | Status: DC | PRN
  Administered 2018-02-25: 2 mg via INTRAVENOUS
  Filled 2018-02-25 (×2): qty 2

## 2018-02-25 MED ORDER — DEXTROSE 50 % IV SOLN
INTRAVENOUS | Status: AC
  Administered 2018-02-25: 50 mL via INTRAVENOUS
  Filled 2018-02-25: qty 50

## 2018-02-25 MED ORDER — IPRATROPIUM BROMIDE 0.02 % IN SOLN
0.5000 mg | Freq: Four times a day (QID) | RESPIRATORY_TRACT | Status: DC
  Administered 2018-02-25 – 2018-02-26 (×4): 0.5 mg via RESPIRATORY_TRACT
  Filled 2018-02-25 (×4): qty 2.5

## 2018-02-25 MED ORDER — PANTOPRAZOLE SODIUM 40 MG IV SOLR
40.0000 mg | Freq: Two times a day (BID) | INTRAVENOUS | Status: DC
  Administered 2018-02-25 – 2018-02-27 (×5): 40 mg via INTRAVENOUS
  Filled 2018-02-25 (×5): qty 40

## 2018-02-25 MED ORDER — FENTANYL CITRATE (PF) 100 MCG/2ML IJ SOLN: 50.0000 ug | Freq: Once | INTRAMUSCULAR | Status: DC

## 2018-02-25 MED ORDER — SODIUM CHLORIDE 0.9 % IV SOLN
INTRAVENOUS | Status: DC
  Administered 2018-02-25 (×2): via INTRAVENOUS

## 2018-02-25 MED ORDER — MIDAZOLAM HCL 5 MG/5ML IJ SOLN
5.0000 mg | Freq: Once | INTRAMUSCULAR | Status: AC
  Administered 2018-02-25: 5 mg via INTRAVENOUS

## 2018-02-25 MED ORDER — ORAL CARE MOUTH RINSE
15.0000 mL | OROMUCOSAL | Status: DC
  Administered 2018-02-25 – 2018-02-26 (×8): 15 mL via OROMUCOSAL

## 2018-02-25 MED ORDER — SODIUM CHLORIDE 0.9 % IV SOLN
250.0000 mL | INTRAVENOUS | Status: DC | PRN
  Administered 2018-02-25: 250 mL via INTRAVENOUS

## 2018-02-25 MED ORDER — ENOXAPARIN SODIUM 40 MG/0.4ML ~~LOC~~ SOLN
40.0000 mg | SUBCUTANEOUS | Status: DC
  Administered 2018-02-25 – 2018-02-28 (×4): 40 mg via SUBCUTANEOUS
  Filled 2018-02-25 (×4): qty 0.4

## 2018-02-25 MED ORDER — SUCRALFATE 1 GM/10ML PO SUSP
1.0000 g | Freq: Four times a day (QID) | ORAL | Status: DC
  Filled 2018-02-25: qty 10

## 2018-02-25 MED ORDER — DEXTROSE 50 % IV SOLN
50.0000 mL | INTRAVENOUS | Status: AC
  Administered 2018-02-25: 50 mL via INTRAVENOUS

## 2018-02-25 MED ORDER — ASPIRIN 81 MG PO CHEW: 324.0000 mg | CHEWABLE_TABLET | ORAL | Status: AC

## 2018-02-25 MED ORDER — PRO-STAT SUGAR FREE PO LIQD
60.0000 mL | Freq: Four times a day (QID) | ORAL | Status: DC
  Administered 2018-02-25 – 2018-02-26 (×3): 60 mL

## 2018-02-25 MED ORDER — FENTANYL BOLUS VIA INFUSION
50.0000 ug | INTRAVENOUS | Status: DC | PRN
  Filled 2018-02-25: qty 50

## 2018-02-25 MED ORDER — MIDAZOLAM HCL 2 MG/2ML IJ SOLN
2.0000 mg | INTRAMUSCULAR | Status: DC | PRN
  Administered 2018-02-25 – 2018-02-26 (×2): 2 mg via INTRAVENOUS
  Filled 2018-02-25 (×2): qty 2

## 2018-02-25 MED ORDER — CHLORHEXIDINE GLUCONATE 0.12% ORAL RINSE (MEDLINE KIT)
15.0000 mL | Freq: Two times a day (BID) | OROMUCOSAL | Status: DC
  Administered 2018-02-25 (×2): 15 mL via OROMUCOSAL

## 2018-02-25 MED ORDER — VECURONIUM BROMIDE 10 MG IV SOLR
10.0000 mg | Freq: Once | INTRAVENOUS | Status: AC
  Administered 2018-02-25: 10 mg via INTRAVENOUS
  Filled 2018-02-25: qty 10

## 2018-02-25 NOTE — ED Notes (Signed)
Pt is desatting to 89 %. MD aware. Resp paged.

## 2018-02-25 NOTE — Progress Notes (Signed)
Pharmacy Electrolyte Monitoring Consult:  Pharmacy consulted to assist in monitoring and replacing electrolytes in this 27 y.o. male admitted on 02/24/2018 with Ingestion   Labs:  Sodium (mmol/L)  Date Value  02/25/2018 136   Potassium (mmol/L)  Date Value  02/25/2018 4.2   Magnesium (mg/dL)  Date Value  31/51/7616 2.0   Calcium (mg/dL)  Date Value  07/37/1062 7.8 (L)   Albumin (g/dL)  Date Value  69/48/5462 4.7    Assessment/Plan: 09/10 @ 0300 electrolytes WNL. Will check stat phos. Will recheck electrolytes w/ am labs.  Thomasene Ripple, PharmD, BCPS Clinical Pharmacist 02/25/2018

## 2018-02-25 NOTE — Consult Note (Signed)
PULMONARY / CRITICAL CARE MEDICINE   Name: Scott Velasquez MRN: [id number] DOB: [birth year]    ADMISSION DATE:  02/24/2018 CONSULTATION DATE:  02/25/2018  REFERRING MD:  Dr. Jannifer Franklin  CHIEF COMPLAINT:  Drug overdose / suicide attempt  HISTORY OF PRESENT ILLNESS:   Scott Velasquez is a 27 y.o. Male with a PMH of previous suicide attempts who presents to [facility] ED on 02/24/18 after reportedly taking at least 20 tablets of Haldol and 30 tablets of Abilify.  He was found outside his home after falling and allegedly striking his head on the ground.  He told EMS that he had taken high doses of Haldol and Abilify, and that this was an attempt to kill himself.    In the ED, pt's mental status fluctuated between somnolence and belligerence to staff.  He was subsequently intubated for airway protection.  Initial workup in the ED revealed EKG with prolonged QTc of 562 ms, no evidence of acute ischemia.  Urine drug screen is positive for Amphetamines, Benzodiazepines, MDMA, Cannabis, and Tricyclic antidepressants.  CXR with no concern for acute cardiopulmonary disease.  Serum Potassium 3.2, Troponin negative, WBC 11.3, Acetaminophen <34, and Salicylate <7.  Pt is admitted for intentional drug overdose (suicide attempt) of Haldol and Abilify requiring intubation for airway protection.  PCCM is consulted for further management.  PAST MEDICAL HISTORY :  He  has a past medical history of Suicide attempt (Scott Velasquez).  PAST SURGICAL HISTORY: He  has a past surgical history that includes No past surgeries.  No Known Allergies  No current facility-administered medications on file prior to encounter.    Current Outpatient Medications on File Prior to Encounter  Medication Sig  . ARIPiprazole (ABILIFY) 15 MG tablet Take 15 mg by mouth daily.  . divalproex (DEPAKOTE) 500 MG DR tablet Take 500 mg by mouth 2 (two) times daily.  Marland Kitchen doxepin (SINEQUAN) 50 MG capsule Take 50 mg by mouth at bedtime.   . haloperidol (HALDOL) 10 MG tablet Take  10 mg by mouth 2 (two) times daily.  . hydrOXYzine (VISTARIL) 25 MG capsule Take 25 mg by mouth every 8 (eight) hours as needed.     FAMILY HISTORY:  His has no family status information on file.    SOCIAL HISTORY: He  reports that he has been smoking. He has been smoking about 1.00 pack per day. He uses smokeless tobacco. He reports that he drank alcohol. He reports that he has current or past drug history. Drug: Methamphetamines.  REVIEW OF SYSTEMS:   Unable to obtain due to intubation and sedation  SUBJECTIVE:  Unable to obtain due to intubation and sedation  VITAL SIGNS: BP 128/88   Pulse 71   Temp (!) 97.5 F (36.4 C) (Oral)   Resp 19   Ht 6' 2"  (1.88 m)   Wt 106.6 kg   SpO2 100%   BMI 30.17 kg/m   HEMODYNAMICS:    VENTILATOR SETTINGS:    INTAKE / OUTPUT: No intake/output data recorded.  PHYSICAL EXAMINATION: General:  Acutely ill appearing male, laying in bed, intubated, in NAD Neuro:  Sedated, withdraws from pain, cough reflex present, Pupils PERRL 1 mm sluggish bilaterally HEENT:  Normocephalic, Atraumatic, neck supple, no JVD Cardiovascular:  Bradycardia, normal rhythm, s1s2, no M/R/G Lungs:  Coarse breath sounds bilaterally with slight expiratory wheezing, even, nonlabored, vent assisted Abdomen:  Soft, nontender, nondistended, BS+ x4 Musculoskeletal:  Normal bulk and tone, no deformities, 2+ pulses throughout Skin:  Warm/dry.  No obvious rashes, lesions, or  ulcerations  LABS:  BMET Recent Labs  Lab 02/24/18 2229  NA 137  K 3.2*  CL 101  CO2 24  BUN 9  CREATININE 1.04  GLUCOSE 187*    Electrolytes Recent Labs  Lab 02/24/18 2229  CALCIUM 9.3  MG 1.8    CBC Recent Labs  Lab 02/24/18 2229  WBC 11.3*  HGB 16.4  HCT 45.8  PLT 267    Coag's No results for input(s): APTT, INR in the last 168 hours.  Sepsis Markers No results for input(s): LATICACIDVEN, PROCALCITON, O2SATVEN in the last 168 hours.  ABG Recent Labs  Lab  02/25/18 0035  PHART 7.32*  PCO2ART 42  PO2ART 74*    Liver Enzymes Recent Labs  Lab 02/24/18 2229  AST 17  ALT 16  ALKPHOS 56  BILITOT 0.8  ALBUMIN 4.7    Cardiac Enzymes Recent Labs  Lab 02/24/18 2229  TROPONINI <0.03    Glucose No results for input(s): GLUCAP in the last 168 hours.  Imaging Dg Chest Portable 1 View  Result Date: 02/25/2018 CLINICAL DATA:  Cough and short of breath overdose EXAM: PORTABLE CHEST 1 VIEW COMPARISON:  None. FINDINGS: Borderline heart size. No acute opacity or pleural effusion. No pneumothorax. IMPRESSION: No active disease. Electronically Signed   By: Donavan Foil M.D.   On: 02/25/2018 00:05    STUDIES:  CT Cervical Spine wo contrast 02/25/18>> Negative CT Head wo contrast 02/25/18>> Negative CT Maxillofacial wo contrast 02/25/18>> No facial bone fracture. Mucosal thickening of the paranasal sinuses may be secondary to intubation. Dental caries with periapical lucency of left upper second molar. UDS 02/24/18>> Positive for Amphetamines, Benzo's, MDMA, Cannaboid, Tricyclics  UA 5/0/09>> negative  CULTURES:  ANTIBIOTICS:  SIGNIFICANT EVENTS: 02/25/18>>Admission to [facility] ICU  LINES/TUBES: ETT 02/24/18>>  DISCUSSION: 27 y.o. Male admitted for intentional drug overdose / suicide attempt with Haldol & Abilify requiring intubation for airway protection.  Pt also with QTc prolongation on EKG.  ASSESSMENT / PLAN:  PULMONARY A: Intubated for airway protection in setting of drug overdose P:   Full vent support, PRVC: 8 cc/kg Wean FiO2 & PEEP as tolerated SBT when parameters met VAP Bundle Pulmonary hygiene Follow intermittent ABG & CXR Prn Bronchodilators  CARDIOVASCULAR A:  Prolonged QTc on EKG in setting of Haldol & Abilify overdose P:  ICU monitoring Maintain MAP >65 Follow EKG q3h If QRS widens, will need Na Bicarb Will keep Serum K & Magnesium at upper normal limits (pharmacy consulted for assistance with electrolyte  replacement) Gentle IVF: NS @ 100 ml/hr  RENAL A:   Hypokalemia P:   Monitor I&O's / urinary output Follow BMP Ensure adequate renal perfusion Avoid nephrotoxic agents as able Replace electrolytes as indicated Potassium and Magnesium repleted in ED, serum f/u in am 9/10 Per Poison Control, keep K and Mag at upper normal limits Gentle IVF: NS @ 100 ml/hr   GASTROINTESTINAL A:   No active issues P:   NPO Will hold off on Pepcid for SUP at this time given prolonged QTc  HEMATOLOGIC A:   No active issues P:  Monitor for s/sx of bleeding Trend CBC Lovenox SQ for VTE Prophylaxis Transfuse for Hgb<7.0  INFECTIOUS A:   Mild Leukocytosis, no obvious source of infection P:   Monitor fever curve Trend WBC's  ENDOCRINE A:   Hyperglycemia   P:   CBG's SSI Follow ICU Hypo/hyperglycemia protocol  NEUROLOGIC A:   Sedation needs in setting of Mechanical ventilation Drug overdose (suicide attempt) w/  Haldol & Abilify P:   RASS goal: 0 to -1 Propofol gtt & Prn Versed pushes to maintain RASS goal Daily WUA Avoid sedating meds as able Provide supportive care Lights on during the day Psych consulted, appreciate input Involuntarily committed until cleared by [facility] Control following, appreciate input, will follow recommendations Obtain Valproic acid level   FAMILY  - Updates: No family currently at bedside for update 9/10 during NP rounds.  - Inter-disciplinary family meet or Palliative Care meeting due by:  03/03/2018    Darel Hong, AGACNP-BC [facility] Pager: [phone]   02/25/2018, 12:58 AM

## 2018-02-25 NOTE — Progress Notes (Signed)
Patients mother at bedside giving some patient history, she states patient is schizophrenic and does not take his medications as prescribed, depakote and abilify. He is usually verbally threatening towards her, "ill will kill you." She does not want him to return back home with her and his grandmother does not either. He was currently living with his grandmother.

## 2018-02-25 NOTE — ED Notes (Signed)
Pt to the er for an overdose. Pt says his friend called 911. Pt says he took a bunch of pills before going to his friends house. Pt is sleeping in between answers and his argumentative when he arouses. Pt begins to yell "I need to take a shit." Pt placed on bed pan for safety. Pt reports hurting all over but not being able to feel anything. Pt is spitting into a bag. VSS at this time.

## 2018-02-25 NOTE — ED Provider Notes (Signed)
Angiocath insertion Performed by: Merrily Brittle  Consent: Verbal consent obtained. Risks and benefits: risks, benefits and alternatives were discussed Time out: Immediately prior to procedure a "time out" was called to verify the correct patient, procedure, equipment, support staff and site/side marked as required.  Preparation: Patient was prepped and draped in the usual sterile fashion.  Vein Location: left ej  Gauge: 18  Normal blood return and flush without difficulty Patient tolerance: Patient tolerated the procedure well with no immediate complications.      Merrily Brittle, MD 02/25/18 [phone]

## 2018-02-25 NOTE — ED Notes (Signed)
Respiratory at bedside to adjust vent settings to aid in ventilation

## 2018-02-25 NOTE — Progress Notes (Signed)
Initial Nutrition Assessment  DOCUMENTATION CODES:   Obesity unspecified  INTERVENTION:  Initiate Vital High Protein at 20 mL/hr (480 mL goal daily volume) + Pro-Stat 60 mL QID via OGT. Provides 1280 kcal, 162 grams of protein, 403 mL H2O daily. With current propofol rate provides 1871 kcal daily.  Provide liquid MVI daily per tube as goal TF regimen does not meet 100% RDIs for vitamins/minerals.  As patient is receiving NS @ 50 mL/hr and D5-1/2NS at 50 mL/hr, provide minimum free water flush of 30 mL Q4hrs to maintain tube patency.  NUTRITION DIAGNOSIS:   Inadequate oral intake related to inability to eat as evidenced by NPO status.  GOAL:   Provide needs based on ASPEN/SCCM guidelines  MONITOR:   Vent status, Labs, Weight trends, TF tolerance, I & O's  REASON FOR ASSESSMENT:   Ventilator    ASSESSMENT:   27 year old male with PMHx of previous suicide attempts admitted for intentional drug overdose (suicide attempt) with Haldol and Abilify requiring intubation on 9/10 for airway protection.   Patient intubated and sedated. On PRVC mode with FiO2 60% and PEEP 10 cmH2O. No family members present at time of RD assessment. No weight history in chart to trend.  Access: 16 Fr. OGT placed 9/10; terminates in stomach per abdominal x-ray today; marked at corner of mouth  MAP: 69-92 mmHg  Patient is currently intubated on ventilator support Ve: 9.7 L/min Temp (24hrs), Avg:97.9 F (36.6 C), Min:97.5 F (36.4 C), Max:98.9 F (37.2 C)  Propofol: 22.4 mL/hr (591 kcal daily)  Medications reviewed and include: Novolog 0-15 units Q4hrs, Solu-Medrol 60 mg Q6hrs IV, pantoprazole, NS @ 50 mL/hr, D5-1/2NS at 50 mL/hr (60 grams dextrose, 204 kcal daily), fentanyl gtt, propofol gtt.  Labs reviewed: CBG 66-111.  Patient does not meet criteria for malnutrition.  Discussed with RN and in rounds. RD notified later in the afternoon that plan is actually to go ahead and start tube feeds  today.  NUTRITION - FOCUSED PHYSICAL EXAM:    Most Recent Value  Orbital Region  No depletion  Upper Arm Region  No depletion  Thoracic and Lumbar Region  No depletion  Buccal Region  Unable to assess  Temple Region  No depletion  Clavicle Bone Region  No depletion  Clavicle and Acromion Bone Region  No depletion  Scapular Bone Region  Unable to assess  Dorsal Hand  No depletion  Patellar Region  No depletion  Anterior Thigh Region  No depletion  Posterior Calf Region  No depletion  Edema (RD Assessment)  None  Hair  Reviewed  Eyes  Unable to assess  Mouth  Unable to assess  Skin  Reviewed  Nails  Reviewed     Diet Order:   Diet Order            Diet NPO time specified  Diet effective now              EDUCATION NEEDS:   No education needs have been identified at this time  Skin:  Skin Assessment: Reviewed RN Assessment  Last BM:  02/25/2018 - small type 6  Height:   Ht Readings from Last 1 Encounters:  02/25/18 6' (1.829 m)    Weight:   Wt Readings from Last 1 Encounters:  02/25/18 106.6 kg    Ideal Body Weight:  80.9 kg  BMI:  Body mass index is 31.87 kg/m.  Estimated Nutritional Needs:   Kcal:  1610-9604 (11-14 kcal/kg)  Protein:  162  grams (2 grams/kg IBW)  Fluid:  2-2.4 L/day (25-30 mL/kg IBW)  Helane Rima, MS, RD, LDN Office: [phone] Pager: [phone] After Hours/Weekend Pager: [phone]

## 2018-02-25 NOTE — ED Notes (Signed)
This RN and Dr York Cerise at the bedside. Pt continues to yell and curse at staff, visitors and other patients. Pt states that he needs to sit up in the bed. This RN and MD attempt to lift pt and adjust him and pt states "I did it better myself". Stretcher is sitting as far up as it can and pt has a pillow behind his head. Pt continues to yell at staff and curse. Pt becomes frustrated and draws back his right fist to hit Dr York Cerise. Pt yelling at staff to "Quit being dick heads because I'm an asshole." Pt is unable to be redirected or calm himself. Pt pulls out his IV in the right hand.

## 2018-02-25 NOTE — Progress Notes (Signed)
Pharmacy Electrolyte Monitoring Consult:  Pharmacy consulted to assist in monitoring and replacing electrolytes in this 27 y.o. male admitted on 02/24/2018 with Ingestion   Labs:  Sodium (mmol/L)  Date Value  02/25/2018 136   Potassium (mmol/L)  Date Value  02/25/2018 5.0   Magnesium (mg/dL)  Date Value  46/65/9935 2.0   Phosphorus (mg/dL)  Date Value  70/17/7939 4.0   Calcium (mg/dL)  Date Value  03/00/9233 7.8 (L)   Albumin (g/dL)  Date Value  00/76/2263 4.7    Assessment/Plan: 9/9 @ 2230 - K 3.2, Mg 1.8. Potassium 10 mEq IV x 5 and magnesium 2 g IV x 1 ordered. Patient received magnesium and 3 runs of potassium which corrected potassium to 4.2. Held remaining 2 runs of potassium and rechecked potassium which resulted at 5. Discontinued additional potassium supplementation at that time.   Will recheck electrolytes at 1800 and with morning labs.   Pharmacy to continue to follow the patient and replace electrolytes per consult.  Pricilla Riffle, PharmD Pharmacy Resident  02/25/2018 3:33 PM

## 2018-02-25 NOTE — Progress Notes (Signed)
RT note: patient was intubated earlier during the coarse of the night alter mental status/airway protection.  Multiple vent changes made post intubation due to low o2 saturation.  Airway suctioned for copious amounts of secretions. Verified last vent setting  [id number] rr 20 100% peep 10 with Dr York Cerise. Patient was transported to CT scan and ICU without incident

## 2018-02-25 NOTE — Progress Notes (Signed)
Patient briefly seen this morning patient is intubated and sedated on vent.  Management as per intensivist.

## 2018-02-25 NOTE — H&P (Addendum)
[facility] - [place] at [facility]   PATIENT NAME: Scott Velasquez    MR#:  [id number]  DATE OF BIRTH:  Apr 19, 1991  DATE OF ADMISSION:  02/24/2018  PRIMARY CARE PHYSICIAN: Patient, No Pcp Per   REQUESTING/REFERRING PHYSICIAN: York Cerise, MD  CHIEF COMPLAINT:   Chief Complaint  Patient presents with  . Ingestion    HISTORY OF PRESENT ILLNESS:  Scott Velasquez  is a 27 y.o. male who presents with chief complaint as above.  Patient is unable to contribute to history due to intubation and critical illness.  History is taken from ED physician and EMS.  Patient apparently was found after falling outside of his home.  He told EMS that he had taken high doses of Haldol and Abilify.  He stated this was an attempt to kill himself.  He was agitated and aggressive in the ED, and then became somnolent with a lot of secretions, and had to be intubated.  Patient's QTC was 562.  Hospitalist were called for admission  PAST MEDICAL HISTORY:   Past Medical History:  Diagnosis Date  . Suicide attempt [facility])      PAST SURGICAL HISTORY:   Past Surgical History:  Procedure Laterality Date  . NO PAST SURGERIES       SOCIAL HISTORY:   Social History   Tobacco Use  . Smoking status: Current Every Day Smoker    Packs/day: 1.00  . Smokeless tobacco: Current User  Substance Use Topics  . Alcohol use: Not Currently     FAMILY HISTORY:  Unable to review due to critical illness   DRUG ALLERGIES:  No Known Allergies  MEDICATIONS AT HOME:   Prior to Admission medications   Medication Sig Start Date End Date Taking? Authorizing Provider  ARIPiprazole (ABILIFY) 15 MG tablet Take 15 mg by mouth daily. 02/14/18  Yes [provider]  divalproex (DEPAKOTE) 500 MG DR tablet Take 500 mg by mouth 2 (two) times daily. 02/14/18  Yes [provider]  doxepin (SINEQUAN) 50 MG capsule Take 50 mg by mouth at bedtime.  02/11/18  Yes [provider]  haloperidol  (HALDOL) 10 MG tablet Take 10 mg by mouth 2 (two) times daily. 02/14/18  Yes [provider]  hydrOXYzine (VISTARIL) 25 MG capsule Take 25 mg by mouth every 8 (eight) hours as needed.  02/14/18  Yes [provider]    REVIEW OF SYSTEMS:  Review of Systems  Unable to perform ROS: Critical illness     VITAL SIGNS:   Vitals:   02/24/18 2223 02/24/18 2300 02/24/18 2330 02/25/18 0000  BP:  134/85 109/88 128/88  Pulse:  92 76 71  Resp:  (!) 27 18 19   Temp:      TempSrc:      SpO2:  98% 97% 100%  Weight: 106.6 kg     Height: 6\' 2"  (1.88 m)      Wt Readings from Last 3 Encounters:  02/24/18 106.6 kg    PHYSICAL EXAMINATION:  Physical Exam  Vitals reviewed. Constitutional: He appears well-developed and well-nourished. No distress.  HENT:  Head: Normocephalic and atraumatic.  Mouth/Throat: Oropharynx is clear and moist.  Eyes: Pupils are equal, round, and reactive to light. Conjunctivae and EOM are normal. No scleral icterus.  Neck: Normal range of motion. Neck supple. No JVD present. No thyromegaly present.  Cardiovascular: Normal rate, regular rhythm and intact distal pulses. Exam reveals no gallop and no friction rub.  No murmur heard. Respiratory: Effort normal  and breath sounds normal. No respiratory distress. He has no wheezes. He has no rales.  GI: Soft. Bowel sounds are normal. He exhibits no distension. There is no tenderness.  Musculoskeletal: Normal range of motion. He exhibits no edema.  No arthritis, no gout  Lymphadenopathy:    He has no cervical adenopathy.  Neurological:  Unable to fully assess due to critical illness  Skin: Skin is warm and dry. No rash noted. No erythema.  Psychiatric:  Unable to assess due to sedation and intubation from critical illness    LABORATORY PANEL:   CBC Recent Labs  Lab 02/24/18 2229  WBC 11.3*  HGB 16.4  HCT 45.8  PLT 267    ------------------------------------------------------------------------------------------------------------------  Chemistries  Recent Labs  Lab 02/24/18 2229  NA 137  K 3.2*  CL 101  CO2 24  GLUCOSE 187*  BUN 9  CREATININE 1.04  CALCIUM 9.3  MG 1.8  AST 17  ALT 16  ALKPHOS 56  BILITOT 0.8   ------------------------------------------------------------------------------------------------------------------  Cardiac Enzymes Recent Labs  Lab 02/24/18 2229  TROPONINI <0.03   ------------------------------------------------------------------------------------------------------------------  RADIOLOGY:  Dg Chest Portable 1 View  Result Date: 02/25/2018 CLINICAL DATA:  Cough and short of breath overdose EXAM: PORTABLE CHEST 1 VIEW COMPARISON:  None. FINDINGS: Borderline heart size. No acute opacity or pleural effusion. No pneumothorax. IMPRESSION: No active disease. Electronically Signed   By: Jasmine Pang M.D.   On: 02/25/2018 00:05    EKG:   Orders placed or performed during the hospital encounter of 02/24/18  . ED EKG  . ED EKG  . EKG 12-Lead  . EKG 12-Lead    IMPRESSION AND PLAN:  Principal Problem:   Suicide attempt (HCC) -patient overdosed on Haldol and Abilify.  QTC was prolonged at 562.  IV magnesium given in the ED.  Psychiatry consult ordered.  Patient required intubation, admit to ICU Active Problems:   Drug overdose -as stated above, treatment as above   Acute respiratory failure with hypoxia -patient was becoming more somnolent and having excessive secretions, requiring intubation  Chart review performed and case discussed with ED provider. Labs, imaging and/or ECG reviewed by provider and discussed with patient/family. Management plans discussed with the patient and/or family.  DVT PROPHYLAXIS: SubQ lovenox   GI PROPHYLAXIS:  None  ADMISSION STATUS: Inpatient     CODE STATUS: Full  TOTAL CRITICAL CARE TIME TAKING CARE OF THIS PATIENT: 50  minutes.   Scott Velasquez 02/25/2018, 12:48 AM  [facility]  Office  [phone]  CC: Primary care physician; Patient, No Pcp Per  Note:  This document was prepared using Dragon voice recognition software and may include unintentional dictation errors.

## 2018-02-25 NOTE — Care Management (Addendum)
RNCM notified that no family listed. In Epic care everywhere it is noted (08/2016) that patient at that time lived with his mother and was expecting a child's birth. RNCM attempted to reach next of kin. Update: [facility] was not able to share any information. I will check with Medicaid to see "who" PCP is and if they have any information about next of kin.

## 2018-02-25 NOTE — Care Management (Addendum)
Patient has Medicaid PCP [facility] [phone] ext. [phone]. They have next of kin Waynetta Sandy (mother) [phone].    RNCM attempted to reach patient's other Beth but inactive number. RNCM spoke with girlfriend "[name]" and "mother of his one year old child". Mother Waynetta Sandy has another number [phone]. He lives with grandma per Randa Evens [phone] and [phone]. Message left for patient's mother Waynetta Sandy at [phone] to call ICU.

## 2018-02-26 ENCOUNTER — Inpatient Hospital Stay: Payer: Medicare Other

## 2018-02-26 DIAGNOSIS — F209 Schizophrenia, unspecified: Secondary | ICD-10-CM

## 2018-02-26 DIAGNOSIS — F1210 Cannabis abuse, uncomplicated: Secondary | ICD-10-CM

## 2018-02-26 DIAGNOSIS — F1510 Other stimulant abuse, uncomplicated: Secondary | ICD-10-CM

## 2018-02-26 LAB — GLUCOSE, CAPILLARY
Glucose-Capillary: 109 mg/dL — ABNORMAL HIGH (ref 70–99)
Glucose-Capillary: 112 mg/dL — ABNORMAL HIGH (ref 70–99)
Glucose-Capillary: 114 mg/dL — ABNORMAL HIGH (ref 70–99)
Glucose-Capillary: 126 mg/dL — ABNORMAL HIGH (ref 70–99)
Glucose-Capillary: 136 mg/dL — ABNORMAL HIGH (ref 70–99)
Glucose-Capillary: 140 mg/dL — ABNORMAL HIGH (ref 70–99)

## 2018-02-26 LAB — COMPREHENSIVE METABOLIC PANEL
ALT: 12 U/L (ref 0–44)
AST: 14 U/L — ABNORMAL LOW (ref 15–41)
Albumin: 3.6 g/dL (ref 3.5–5.0)
Alkaline Phosphatase: 44 U/L (ref 38–126)
Anion gap: 4 — ABNORMAL LOW (ref 5–15)
BUN: 13 mg/dL (ref 6–20)
CO2: 28 mmol/L (ref 22–32)
Calcium: 8.5 mg/dL — ABNORMAL LOW (ref 8.9–10.3)
Chloride: 109 mmol/L (ref 98–111)
Creatinine, Ser: 0.78 mg/dL (ref 0.61–1.24)
GFR calc Af Amer: >60 mL/min (ref >60)
GFR calc non Af Amer: >60 mL/min (ref >60)
Glucose, Bld: 141 mg/dL — ABNORMAL HIGH (ref 70–99)
Potassium: 4.4 mmol/L (ref 3.5–5.1)
Sodium: 141 mmol/L (ref 135–145)
Total Bilirubin: 0.4 mg/dL (ref 0.3–1.2)
Total Protein: 6.5 g/dL (ref 6.5–8.1)

## 2018-02-26 LAB — HIV ANTIBODY (ROUTINE TESTING W REFLEX): HIV Screen 4th Generation wRfx: NONREACTIVE

## 2018-02-26 LAB — MAGNESIUM: Magnesium: 2.2 mg/dL (ref 1.7–2.4)

## 2018-02-26 MED ORDER — DEXMEDETOMIDINE HCL IN NACL 400 MCG/100ML IV SOLN
0.4000 ug/kg/h | INTRAVENOUS | Status: DC
  Administered 2018-02-26: 1 ug/kg/h via INTRAVENOUS
  Administered 2018-02-26: 0.8 ug/kg/h via INTRAVENOUS
  Filled 2018-02-26: qty 100

## 2018-02-26 MED ORDER — LORAZEPAM 2 MG/ML IJ SOLN
1.0000 mg | Freq: Once | INTRAMUSCULAR | Status: AC
  Administered 2018-02-26: 1 mg via INTRAVENOUS
  Filled 2018-02-26: qty 1

## 2018-02-26 MED ORDER — GUAIFENESIN 100 MG/5ML PO SOLN
5.0000 mL | ORAL | Status: DC | PRN
  Administered 2018-02-26 – 2018-02-27 (×2): 100 mg via ORAL
  Filled 2018-02-26 (×3): qty 5

## 2018-02-26 MED ORDER — PHENOL 1.4 % MT LIQD
1.0000 | OROMUCOSAL | Status: DC | PRN
  Administered 2018-02-26: 19:00:00 1 via OROMUCOSAL
  Filled 2018-02-26: qty 177

## 2018-02-26 MED ORDER — IPRATROPIUM-ALBUTEROL 0.5-2.5 (3) MG/3ML IN SOLN
3.0000 mL | Freq: Four times a day (QID) | RESPIRATORY_TRACT | Status: DC | PRN
  Administered 2018-02-26: 3 mL via RESPIRATORY_TRACT
  Filled 2018-02-26 (×2): qty 3

## 2018-02-26 MED ORDER — IPRATROPIUM BROMIDE 0.02 % IN SOLN: 0.5000 mg | Freq: Four times a day (QID) | RESPIRATORY_TRACT | Status: DC | PRN

## 2018-02-26 MED ORDER — ADULT MULTIVITAMIN W/MINERALS CH
1.0000 | ORAL_TABLET | Freq: Every day | ORAL | Status: DC
  Administered 2018-02-27 – 2018-03-01 (×3): 1 via ORAL
  Filled 2018-02-26 (×3): qty 1

## 2018-02-26 NOTE — Progress Notes (Signed)
Report given to [name] for patient to be transferred to room 124 with 1 bag of personal belongings, cardiac monitoring, oxygen and 1:1 sitter. Patient wished for parents to be notified and unit secretary informed father Scott Velasquez when he need called for an update.

## 2018-02-26 NOTE — Progress Notes (Signed)
Follow up - Critical Care Medicine Note  Patient Details:    Scott Velasquez is an 27 y.o. male. with a PMH of previous suicide attempts who presents to [facility] ED on 02/24/18 after reportedly taking at least 20 tablets of Haldol and 30 tablets of Abilify.He was found outside his home after falling and allegedly striking his head on the ground. He told EMS that he had taken high doses of Haldol and Abilify, and that this was an attempt to kill himself.In the ED, pt's mental status fluctuated between somnolence and belligerence to staff. He was subsequently intubated for airway protection. Initial workup in the ED revealed EKG with prolonged QTc of 562 ms, no evidence of acute ischemia. Urine drug screen is positive for Amphetamines  Lines, Airways, Drains: Airway 7.5 mm (Active)  Secured at (cm) 23 cm 02/26/2018  8:09 AM  Measured From Lips 02/26/2018  8:09 AM  Secured Location Left 02/26/2018  8:09 AM  Secured By [employer] 02/26/2018  8:09 AM  Tube Holder Repositioned Yes 02/26/2018  3:25 AM  Cuff Pressure (cm H2O) 24 cm H2O 02/26/2018  8:09 AM  Site Condition Dry 02/25/2018 11:14 PM     NG/OG Tube Orogastric 16 Fr. Center mouth Xray;Aucultation Measured external length of tube (Active)  Site Assessment Clean;Dry;Intact 02/26/2018  4:00 AM  Ongoing Placement Verification No change in cm markings or external length of tube from initial placement 02/26/2018  4:00 AM  Status Infusing tube feed 02/26/2018  4:00 AM  Amount of suction 80 mmHg 02/25/2018  4:00 PM  Drainage Appearance Bile;Clear;Pink tinged;Clots;Brown 02/25/2018  4:00 PM  Output (mL) 550 mL 02/25/2018  6:00 PM     Urethral Catheter Sherrie, RN Double-lumen;Straight-tip 16 Fr. (Active)  Indication for Insertion or Continuance of Catheter Unstable critical patients (first 24-48 hours) 02/26/2018  4:00 AM  Site Assessment Clean;Intact;Dry 02/26/2018  4:00 AM  Catheter Maintenance Bag below level of bladder;Insertion date on drainage  bag;Catheter secured;No dependent loops;Drainage bag/tubing not touching floor;Seal intact 02/26/2018  4:00 AM  Collection Container Standard drainage bag 02/26/2018  4:00 AM  Securement Method Securing device (Describe) 02/26/2018  4:00 AM  Urinary Catheter Interventions Unclamped 02/26/2018  4:00 AM  Output (mL) 265 mL 02/26/2018  6:00 AM    Anti-infectives:  Anti-infectives (From admission, onward)   None      Microbiology: Results for orders placed or performed during the hospital encounter of 02/24/18  MRSA PCR Screening     Status: None   Collection Time: 02/25/18  3:14 AM  Result Value Ref Range Status   MRSA by PCR NEGATIVE NEGATIVE Final    Comment:        The GeneXpert MRSA Assay (FDA approved for NASAL specimens only), is one component of a comprehensive MRSA colonization surveillance program. It is not intended to diagnose MRSA infection nor to guide or monitor treatment for MRSA infections. Performed at [facility], [address] [place], [place] [address]     Studies: Ct Head Wo Contrast  Result Date: 02/25/2018 CLINICAL DATA:  Head trauma, intracranial venous injury suspected Altered level of consciousness (LOC), unexplained; Facial trauma, fx suspected took overdose then fell and hit face on concrete; C-spine fx, traumatic. EXAM: CT HEAD WITHOUT CONTRAST CT MAXILLOFACIAL WITHOUT CONTRAST CT CERVICAL SPINE WITHOUT CONTRAST TECHNIQUE: Multidetector CT imaging of the head, cervical spine, and maxillofacial structures were performed using the standard protocol without intravenous contrast. Multiplanar CT image reconstructions of the cervical spine and maxillofacial structures were also  generated. COMPARISON:  None. FINDINGS: CT HEAD FINDINGS Brain: No evidence of cerebral edema. No intracranial hemorrhage, mass effect, or midline shift. No hydrocephalus. The basilar cisterns are patent. No evidence of territorial infarct or acute ischemia. No extra-axial or  intracranial fluid collection. Vascular: No hyperdense vessel or unexpected calcification. Skull: No fracture or focal lesion. Other: None. CT MAXILLOFACIAL FINDINGS Osseous: Nasal bone, zygomatic arches, and mandibles are intact. The temporomandibular joints are congruent. Dental caries with periapical lucency about left upper second molar. Orbits: Both orbits and globes are intact.  No orbital fracture. Sinuses: Mucosal thickening throughout the paranasal sinuses with opacification of ethmoid air cells. No sinus fluid levels or fracture. Enteric and endotracheal tubes in place. Soft tissues: Negative. CT CERVICAL SPINE FINDINGS Alignment: Slight left head tilt. Otherwise normal without traumatic subluxation. Skull base and vertebrae: No acute fracture. Vertebral body heights are maintained. The dens and skull base are intact. Soft tissues and spinal canal: No prevertebral fluid or swelling. No visible canal hematoma. Disc levels:  Normal. Upper chest: Negative. Other: None. IMPRESSION: 1. Negative head CT. 2. No facial bone fracture. Mucosal thickening of the paranasal sinuses may be secondary to intubation. Dental caries with periapical lucency of left upper second molar. 3. Negative CT of the cervical spine. Electronically Signed   By: Narda Rutherford M.D.   On: 02/25/2018 02:23   Ct Cervical Spine Wo Contrast  Result Date: 02/25/2018 CLINICAL DATA:  Head trauma, intracranial venous injury suspected Altered level of consciousness (LOC), unexplained; Facial trauma, fx suspected took overdose then fell and hit face on concrete; C-spine fx, traumatic. EXAM: CT HEAD WITHOUT CONTRAST CT MAXILLOFACIAL WITHOUT CONTRAST CT CERVICAL SPINE WITHOUT CONTRAST TECHNIQUE: Multidetector CT imaging of the head, cervical spine, and maxillofacial structures were performed using the standard protocol without intravenous contrast. Multiplanar CT image reconstructions of the cervical spine and maxillofacial structures were also  generated. COMPARISON:  None. FINDINGS: CT HEAD FINDINGS Brain: No evidence of cerebral edema. No intracranial hemorrhage, mass effect, or midline shift. No hydrocephalus. The basilar cisterns are patent. No evidence of territorial infarct or acute ischemia. No extra-axial or intracranial fluid collection. Vascular: No hyperdense vessel or unexpected calcification. Skull: No fracture or focal lesion. Other: None. CT MAXILLOFACIAL FINDINGS Osseous: Nasal bone, zygomatic arches, and mandibles are intact. The temporomandibular joints are congruent. Dental caries with periapical lucency about left upper second molar. Orbits: Both orbits and globes are intact.  No orbital fracture. Sinuses: Mucosal thickening throughout the paranasal sinuses with opacification of ethmoid air cells. No sinus fluid levels or fracture. Enteric and endotracheal tubes in place. Soft tissues: Negative. CT CERVICAL SPINE FINDINGS Alignment: Slight left head tilt. Otherwise normal without traumatic subluxation. Skull base and vertebrae: No acute fracture. Vertebral body heights are maintained. The dens and skull base are intact. Soft tissues and spinal canal: No prevertebral fluid or swelling. No visible canal hematoma. Disc levels:  Normal. Upper chest: Negative. Other: None. IMPRESSION: 1. Negative head CT. 2. No facial bone fracture. Mucosal thickening of the paranasal sinuses may be secondary to intubation. Dental caries with periapical lucency of left upper second molar. 3. Negative CT of the cervical spine. Electronically Signed   By: Narda Rutherford M.D.   On: 02/25/2018 02:23   Dg Chest Port 1 View  Result Date: 02/26/2018 CLINICAL DATA:  Hypoxia EXAM: PORTABLE CHEST 1 VIEW COMPARISON:  February 25, 2018 FINDINGS: Endotracheal tube tip is 3.6 cm above the carina. Nasogastric tube tip and side port are in the  stomach. No pneumothorax. There is consolidation in the left lower lobe as well as in the medial right base. There are small  pleural effusions bilaterally. Lungs elsewhere clear. Heart is mildly enlarged with pulmonary vascularity normal. No adenopathy. No bone lesions. IMPRESSION: Tube positions as described without pneumothorax. Airspace consolidation left lower lobe and medial right base with small pleural effusions bilaterally. Lungs elsewhere clear. Stable cardiac prominence. Electronically Signed   By: Bretta Bang III M.D.   On: 02/26/2018 07:02   Dg Chest Portable 1 View  Result Date: 02/25/2018 CLINICAL DATA:  27 y/o  M; status post intubation. EXAM: PORTABLE CHEST 1 VIEW COMPARISON:  02/24/2018 chest radiograph FINDINGS: Stable normal cardiac silhouette given projection and technique. Endotracheal tube tip projects 3.1 cm above the carina. Enteric tube tip extends below the field of view into the abdomen. Clear lungs. No pleural effusion or pneumothorax. No acute osseous abnormality is evident. IMPRESSION: 1. Endotracheal tube tip projects 3.1 cm above the carina. Enteric tube tip is below field of view in the abdomen. 2. No acute pulmonary process identified. Electronically Signed   By: Mitzi Hansen M.D.   On: 02/25/2018 01:01   Dg Chest Portable 1 View  Result Date: 02/25/2018 CLINICAL DATA:  Cough and short of breath overdose EXAM: PORTABLE CHEST 1 VIEW COMPARISON:  None. FINDINGS: Borderline heart size. No acute opacity or pleural effusion. No pneumothorax. IMPRESSION: No active disease. Electronically Signed   By: Jasmine Pang M.D.   On: 02/25/2018 00:05   Dg Abd Portable 1v  Result Date: 02/25/2018 CLINICAL DATA:  OG tube placement EXAM: PORTABLE ABDOMEN - 1 VIEW COMPARISON:  None. FINDINGS: Orogastric tube with the tip projecting over the stomach. There is no bowel dilatation to suggest obstruction. There is no evidence of pneumoperitoneum, portal venous gas or pneumatosis. There are no pathologic calcifications along the expected course of the ureters. The osseous structures are  unremarkable. IMPRESSION: Orogastric tube with the tip projecting over the stomach. Electronically Signed   By: Elige Ko   On: 02/25/2018 13:01   Ct Maxillofacial Wo Contrast  Result Date: 02/25/2018 CLINICAL DATA:  Head trauma, intracranial venous injury suspected Altered level of consciousness (LOC), unexplained; Facial trauma, fx suspected took overdose then fell and hit face on concrete; C-spine fx, traumatic. EXAM: CT HEAD WITHOUT CONTRAST CT MAXILLOFACIAL WITHOUT CONTRAST CT CERVICAL SPINE WITHOUT CONTRAST TECHNIQUE: Multidetector CT imaging of the head, cervical spine, and maxillofacial structures were performed using the standard protocol without intravenous contrast. Multiplanar CT image reconstructions of the cervical spine and maxillofacial structures were also generated. COMPARISON:  None. FINDINGS: CT HEAD FINDINGS Brain: No evidence of cerebral edema. No intracranial hemorrhage, mass effect, or midline shift. No hydrocephalus. The basilar cisterns are patent. No evidence of territorial infarct or acute ischemia. No extra-axial or intracranial fluid collection. Vascular: No hyperdense vessel or unexpected calcification. Skull: No fracture or focal lesion. Other: None. CT MAXILLOFACIAL FINDINGS Osseous: Nasal bone, zygomatic arches, and mandibles are intact. The temporomandibular joints are congruent. Dental caries with periapical lucency about left upper second molar. Orbits: Both orbits and globes are intact.  No orbital fracture. Sinuses: Mucosal thickening throughout the paranasal sinuses with opacification of ethmoid air cells. No sinus fluid levels or fracture. Enteric and endotracheal tubes in place. Soft tissues: Negative. CT CERVICAL SPINE FINDINGS Alignment: Slight left head tilt. Otherwise normal without traumatic subluxation. Skull base and vertebrae: No acute fracture. Vertebral body heights are maintained. The dens and skull base are intact. Soft  tissues and spinal canal: No  prevertebral fluid or swelling. No visible canal hematoma. Disc levels:  Normal. Upper chest: Negative. Other: None. IMPRESSION: 1. Negative head CT. 2. No facial bone fracture. Mucosal thickening of the paranasal sinuses may be secondary to intubation. Dental caries with periapical lucency of left upper second molar. 3. Negative CT of the cervical spine. Electronically Signed   By: Narda Rutherford M.D.   On: 02/25/2018 02:23    Consults:    Subjective:    Overnight Issues: overnight patient remained sedated and had no issues  Objective:  Vital signs for last 24 hours: Temp:  [98.9 F (37.2 C)-99.7 F (37.6 C)] 99.6 F (37.6 C) (09/11 0400) Pulse Rate:  [46-94] 46 (09/11 0630) Resp:  [12-20] 20 (09/11 0630) BP: (100-163)/(60-87) 124/64 (09/11 0630) SpO2:  [86 %-100 %] 94 % (09/11 0809) FiO2 (%):  [60 %] 60 % (09/11 0809) Weight:  [107.1 kg] 107.1 kg (09/11 0400)  Hemodynamic parameters for last 24 hours:    Intake/Output from previous day: 09/10 0701 - 09/11 0700 In: 3952.9 [I.V.:3633.6; NG/GT:274; IV Piggyback:45.3] Out: 2380 [Urine:1830; Emesis/NG output:550]  Intake/Output this shift: No intake/output data recorded.  Vent settings for last 24 hours: Vent Mode: PRVC FiO2 (%):  [60 %] 60 % Set Rate:  [20 bmp] 20 bmp Vt Set:  [500 mL] 500 mL PEEP:  [10 cmH20] 10 cmH20 Plateau Pressure:  [26 cmH20-27 cmH20] 26 cmH20  Physical Exam:  Vital signs: Please see the above listed vital signs HEENT: Trachea midline, no thyromegaly appreciated, no accessory muscle utilization Cardiovascular: Regular rhythm Pulmonary: Clear to auscultation Abdominal: Positive bowel sounds Extremity: No clubbing cyanosis or edema noted Neurologic: Patient was all extremities is awake alert with decreasing sedation  Assessment/Plan:   27 y.o. Male admitted for intentional drug overdose / suicide attempt with Haldol & Abilify requiring intubation for airway protection.  Pt also with QTc  prolongation on EKG. Appreciate poison control assistance, have been monitoring QTC closely. Will perform spontaneous awakening and breathing trial today and assess for possible extubation  Patient with multiple suicide attempts, intentional drug overdose, will require psychiatric evaluation and possible inpatient treatment    Critical Care Total Time 35 minutes  Scott Velasquez 02/26/2018  *Care during the described time interval was provided by me and/or other providers on the critical care team.  I have reviewed this patient's available data, including medical history, events of note, physical examination and test results as part of my evaluation.

## 2018-02-26 NOTE — Consult Note (Signed)
[facility] Consult   Reason for Consult: Consult for this 27 year old man who came into the hospital after a suicide attempt Referring Physician: Posey Pronto Patient Identification: Scott Velasquez MRN:  [id number] Principal Diagnosis: Suicide attempt [facility]) Diagnosis:   Patient Active Problem List   Diagnosis Date Noted  . Schizophrenia ([place]) [F20.9] 02/26/2018  . Amphetamine abuse ([place]) [F15.10] 02/26/2018  . Marijuana abuse [F12.10] 02/26/2018  . Suicide attempt ([place]) [T14.91XA] 02/25/2018  . Drug overdose [T50.901A] 02/25/2018    Total Time spent with patient: 1 hour  Subjective:   Scott Velasquez is a 27 y.o. male patient admitted with "I took all my pills".  HPI: Patient interviewed chart reviewed.  Also spoke with the patient's father.  This is a 60 year old man with long-standing behavioral and mood and substance abuse problems.  Came into the hospital after taking pretty much his entire bottle of both Abilify and Haldol.  Patient says he was at his friend's house when he did not.  It was kind of impulsive but he also says he wanted to kill himself and that he always thinks about killing himself.  He says he wanted to die because he has "lost everything" by which she means jobs and girlfriends money etc. all of which has been going on over a long time.  He has trouble defining a length of time that his mood is actually been bad but repeat several times that he wishes that he were dead.  Patient endorses some intermittent hallucinations which are poorly described.  During the interview he was coughing very hard and kept shoving a Yankauer suction device into his mouth even though he did not appear to have any significant secretions.  This would of course make him gag which made it even harder to talk with him.  Father tells me that this is a pattern that is happened over and over again.  Patient will not take his medicine regularly and then will overdose on all of it.  Does not follow  up with regular outpatient treatment.  Patient minimized substance abuse but his drug screen was positive for amphetamines and cannabis as well as benzodiazepines and try cyclic's.  Medical history: Had some aspiration.  Might of had a pneumonia.  Respiratory therapy is still working with him to see whether he needs continued oxygen.  Otherwise in reasonably good medical health.  Social history: Lives with his mother and grandmother in [place].  Not working regularly.  Father lives in [place] but is evidently still involved in his life.  Substance abuse history: Patient has a history apparently of abuse of multiple substances including amphetamines and marijuana.  Drug abuse appears to have played a major role in much of his last behavior issues  Past Psychiatric History: Multiple hospitalizations almost all for suicide attempts.  We do not have much record in the chart but the father says that he has been hospitalized in several states and at several facilities over the years.  Tends to overdose on his medicine.  Poor compliance with outpatient treatment.  Risk to Self:   Risk to Others:   Prior Inpatient Therapy:   Prior Outpatient Therapy:    Past Medical History:  Past Medical History:  Diagnosis Date  . Suicide attempt [facility])     Past Surgical History:  Procedure Laterality Date  . NO PAST SURGERIES     Family History: History reviewed. No pertinent family history. Family Psychiatric  History: None known Social History:  Social History  Substance and Sexual Activity  Alcohol Use Not Currently     Social History   Substance and Sexual Activity  Drug Use Yes  . Types: Methamphetamines    Social History   Socioeconomic History  . Marital status: Single    Spouse name: Not on file  . Number of children: Not on file  . Years of education: Not on file  . Highest education level: Not on file  Occupational History  . Not on file  Social Needs  . Financial resource  strain: Not on file  . Food insecurity:    Worry: Not on file    Inability: Not on file  . Transportation needs:    Medical: Not on file    Non-medical: Not on file  Tobacco Use  . Smoking status: Current Every Day Smoker    Packs/day: 1.00  . Smokeless tobacco: Current User  Substance and Sexual Activity  . Alcohol use: Not Currently  . Drug use: Yes    Types: Methamphetamines  . Sexual activity: Not Currently  Lifestyle  . Physical activity:    Days per week: Not on file    Minutes per session: Not on file  . Stress: Not on file  Relationships  . Social connections:    Talks on phone: Not on file    Gets together: Not on file    Attends religious service: Not on file    Active member of club or organization: Not on file    Attends meetings of clubs or organizations: Not on file    Relationship status: Not on file  Other Topics Concern  . Not on file  Social History Narrative  . Not on file   Additional Social History:    Allergies:  No Known Allergies  Labs:  Results for orders placed or performed during the hospital encounter of 02/24/18 (from the past 48 hour(s))  Acetaminophen level     Status: Abnormal   Collection Time: 02/24/18 10:29 PM  Result Value Ref Range   Acetaminophen (Tylenol), Serum <10 (L) 10 - 30 ug/mL    Comment: (NOTE) Therapeutic concentrations vary significantly. A range of 10-30 ug/mL  may be an effective concentration for many patients. However, some  are best treated at concentrations outside of this range. Acetaminophen concentrations >150 ug/mL at 4 hours after ingestion  and >50 ug/mL at 12 hours after ingestion are often associated with  toxic reactions. Performed at [facility], [address] [place], [place] [address]   Comprehensive metabolic panel     Status: Abnormal   Collection Time: 02/24/18 10:29 PM  Result Value Ref Range   Sodium 137 135 - 145 mmol/L   Potassium 3.2 (L) 3.5 - 5.1 mmol/L   Chloride 101  98 - 111 mmol/L   CO2 24 22 - 32 mmol/L   Glucose, Bld 187 (H) 70 - 99 mg/dL   BUN 9 6 - 20 mg/dL   Creatinine, Ser 1.04 0.61 - 1.24 mg/dL   Calcium 9.3 8.9 - 10.3 mg/dL   Total Protein 7.7 6.5 - 8.1 g/dL   Albumin 4.7 3.5 - 5.0 g/dL   AST 17 15 - 41 U/L   ALT 16 0 - 44 U/L   Alkaline Phosphatase 56 38 - 126 U/L   Total Bilirubin 0.8 0.3 - 1.2 mg/dL   GFR calc non Af Amer >60 >60 mL/min   GFR calc Af Amer >60 >60 mL/min    Comment: (NOTE) The eGFR has been  calculated using the CKD EPI equation. This calculation has not been validated in all clinical situations. eGFR's persistently <60 mL/min signify possible Chronic Kidney Disease.    Anion gap 12 5 - 15    Comment: Performed at [facility], [address] [place], [place] [address]  Ethanol     Status: None   Collection Time: 02/24/18 10:29 PM  Result Value Ref Range   Alcohol, Ethyl (B) <10 <10 mg/dL    Comment: (NOTE) Lowest detectable limit for serum alcohol is 10 mg/dL. For medical purposes only. Performed at [facility], [address] [place], [place] [address]   Lipase, blood     Status: None   Collection Time: 02/24/18 10:29 PM  Result Value Ref Range   Lipase 20 11 - 51 U/L    Comment: Performed at [facility], [address] [place], [place] [address]  Salicylate level     Status: None   Collection Time: 02/24/18 10:29 PM  Result Value Ref Range   Salicylate Lvl <3.6 2.8 - 30.0 mg/dL    Comment: Performed at [facility], [address] [place], [place] [address]  Troponin I     Status: None   Collection Time: 02/24/18 10:29 PM  Result Value Ref Range   Troponin I <0.03 <0.03 ng/mL    Comment: Performed at [facility], [address] [place], [place] [address]  CBC with Differential     Status: Abnormal   Collection Time: 02/24/18 10:29 PM  Result Value Ref Range   WBC 11.3 (H) 3.8 - 10.6 K/uL   RBC 5.15 4.40 - 5.90 MIL/uL   Hemoglobin 16.4 13.0  - 18.0 g/dL    Comment: RESULT REPEATED AND VERIFIED   HCT 45.8 40.0 - 52.0 %    Comment: RESULT REPEATED AND VERIFIED   MCV 89.0 80.0 - 100.0 fL   MCH 31.9 26.0 - 34.0 pg   MCHC 35.8 32.0 - 36.0 g/dL   RDW 13.0 11.5 - 14.5 %   Platelets 267 150 - 440 K/uL   Neutrophils Relative % 85 %   Neutro Abs 9.5 (H) 1.4 - 6.5 K/uL   Lymphocytes Relative 9 %   Lymphs Abs 1.0 1.0 - 3.6 K/uL   Monocytes Relative 5 %   Monocytes Absolute 0.6 0.2 - 1.0 K/uL   Eosinophils Relative 1 %   Eosinophils Absolute 0.2 0 - 0.7 K/uL   Basophils Relative 0 %   Basophils Absolute 0.0 0 - 0.1 K/uL    Comment: Performed at [facility], [address] [place], [place] [address]  Urine Drug Screen, Qualitative     Status: Abnormal   Collection Time: 02/24/18 10:29 PM  Result Value Ref Range   Tricyclic, Ur Screen POSITIVE (A) NONE DETECTED   Amphetamines, Ur Screen POSITIVE (A) NONE DETECTED   MDMA (Ecstasy)Ur Screen POSITIVE (A) NONE DETECTED   Cocaine Metabolite,Ur [place] NONE DETECTED NONE DETECTED   Opiate, Ur Screen NONE DETECTED NONE DETECTED   Phencyclidine (PCP) Ur S NONE DETECTED NONE DETECTED   Cannabinoid 50 Ng, Ur [place] POSITIVE (A) NONE DETECTED   Barbiturates, Ur Screen NONE DETECTED NONE DETECTED   Benzodiazepine, Ur Scrn POSITIVE (A) NONE DETECTED   Methadone Scn, Ur NONE DETECTED NONE DETECTED    Comment: (NOTE) Tricyclics + metabolites, urine    Cutoff 1000 ng/mL Amphetamines + metabolites, urine  Cutoff 1000 ng/mL MDMA (Ecstasy), urine  Cutoff 500 ng/mL Cocaine Metabolite, urine          Cutoff 300 ng/mL Opiate + metabolites, urine        Cutoff 300 ng/mL Phencyclidine (PCP), urine         Cutoff 25 ng/mL Cannabinoid, urine                 Cutoff 50 ng/mL Barbiturates + metabolites, urine  Cutoff 200 ng/mL Benzodiazepine, urine              Cutoff 200 ng/mL Methadone, urine                   Cutoff 300 ng/mL The urine drug screen provides only a preliminary,  unconfirmed analytical test result and should not be used for non-medical purposes. Clinical consideration and professional judgment should be applied to any positive drug screen result due to possible interfering substances. A more specific alternate chemical method must be used in order to obtain a confirmed analytical result. Gas chromatography / mass spectrometry (GC/MS) is the preferred confirmat ory method. Performed at [facility], [address] [place], [place] [address]   Urinalysis, Complete w Microscopic     Status: Abnormal   Collection Time: 02/24/18 10:29 PM  Result Value Ref Range   Color, Urine YELLOW (A) YELLOW   APPearance HAZY (A) CLEAR   Specific Gravity, Urine 1.013 1.005 - 1.030   pH 6.0 5.0 - 8.0   Glucose, UA NEGATIVE NEGATIVE mg/dL   Hgb urine dipstick NEGATIVE NEGATIVE   Bilirubin Urine NEGATIVE NEGATIVE   Ketones, ur 20 (A) NEGATIVE mg/dL   Protein, ur NEGATIVE NEGATIVE mg/dL   Nitrite NEGATIVE NEGATIVE   Leukocytes, UA NEGATIVE NEGATIVE   RBC / HPF 0-5 0 - 5 RBC/hpf   WBC, UA 0-5 0 - 5 WBC/hpf   Bacteria, UA RARE (A) NONE SEEN   Squamous Epithelial / LPF NONE SEEN 0 - 5   Mucus PRESENT     Comment: Performed at [facility], [address], [place], [place] [address]  Magnesium     Status: None   Collection Time: 02/24/18 10:29 PM  Result Value Ref Range   Magnesium 1.8 1.7 - 2.4 mg/dL    Comment: Performed at [facility], [address] [place], [place] [address]  Blood gas, arterial ([facility] & [facility] ONLY)     Status: Abnormal   Collection Time: 02/25/18 12:35 AM  Result Value Ref Range   FIO2 0.40    Delivery systems VENTILATOR    VT 500 mL   Peep/cpap 5.0 cm H20   pH, Arterial 7.32 (L) 7.350 - 7.450   pCO2 arterial 42 32.0 - 48.0 mmHg   pO2, Arterial 74 (L) 83.0 - 108.0 mmHg   Bicarbonate 21.6 20.0 - 28.0 mmol/L   Acid-base deficit 4.4 (H) 0.0 - 2.0 mmol/L   O2 Saturation 93.3 %   Patient temperature 37.0     Collection site RIGHT RADIAL    Sample type ARTERIAL DRAW    Allens test (pass/fail) PASS PASS   Mechanical Rate 12     Comment: Performed at [facility], [address] [place], [place] [address]  Glucose, capillary     Status: Abnormal   Collection Time: 02/25/18  3:05 AM  Result Value Ref Range   Glucose-Capillary 123 (H) 70 - 99 mg/dL  MRSA PCR Screening     Status: None   Collection Time: 02/25/18  3:14 AM  Result  Value Ref Range   MRSA by PCR NEGATIVE NEGATIVE    Comment:        The GeneXpert MRSA Assay (FDA approved for NASAL specimens only), is one component of a comprehensive MRSA colonization surveillance program. It is not intended to diagnose MRSA infection nor to guide or monitor treatment for MRSA infections. Performed at [facility], [address] [place], [place] [address]   Valproic acid level     Status: Abnormal   Collection Time: 02/25/18  3:35 AM  Result Value Ref Range   Valproic Acid Lvl <10 (L) 50.0 - 100.0 ug/mL    Comment: Performed at [facility], [address] [place], [place] [address]  Triglycerides     Status: None   Collection Time: 02/25/18  3:35 AM  Result Value Ref Range   Triglycerides 93 <150 mg/dL    Comment: Performed at [facility], [address] [place], [place] [address]  HIV antibody (Routine Testing)     Status: None   Collection Time: 02/25/18  3:35 AM  Result Value Ref Range   HIV Screen 4th Generation wRfx Non Reactive Non Reactive    Comment: (NOTE) Performed At: [facility] [place], [place] [id number] Rush Farmer MD [id number]   CBC     Status: Abnormal   Collection Time: 02/25/18  3:35 AM  Result Value Ref Range   WBC 8.0 3.8 - 10.6 K/uL   RBC 4.25 (L) 4.40 - 5.90 MIL/uL   Hemoglobin 13.6 13.0 - 18.0 g/dL   HCT 37.8 (L) 40.0 - 52.0 %   MCV 89.1 80.0 - 100.0 fL   MCH 32.0 26.0 - 34.0 pg   MCHC 36.0 32.0 - 36.0 g/dL   RDW 12.8 11.5 -  14.5 %   Platelets 238 150 - 440 K/uL    Comment: Performed at [facility], [address] [place], [place] [address]  Basic metabolic panel     Status: Abnormal   Collection Time: 02/25/18  3:35 AM  Result Value Ref Range   Sodium 136 135 - 145 mmol/L   Potassium 4.2 3.5 - 5.1 mmol/L   Chloride 108 98 - 111 mmol/L   CO2 24 22 - 32 mmol/L   Glucose, Bld 126 (H) 70 - 99 mg/dL   BUN 7 6 - 20 mg/dL   Creatinine, Ser 0.76 0.61 - 1.24 mg/dL   Calcium 7.8 (L) 8.9 - 10.3 mg/dL   GFR calc non Af Amer >60 >60 mL/min   GFR calc Af Amer >60 >60 mL/min    Comment: (NOTE) The eGFR has been calculated using the CKD EPI equation. This calculation has not been validated in all clinical situations. eGFR's persistently <60 mL/min signify possible Chronic Kidney Disease.    Anion gap 4 (L) 5 - 15    Comment: Performed at [facility], [address] [place], [place] [address]  Magnesium     Status: None   Collection Time: 02/25/18  3:36 AM  Result Value Ref Range   Magnesium 2.0 1.7 - 2.4 mg/dL    Comment: Performed at [facility] [address] [place], [place] [address]  Phosphorus     Status: None   Collection Time: 02/25/18  3:36 AM  Result Value Ref Range   Phosphorus 4.0 2.5 - 4.6 mg/dL    Comment: Performed at [facility], [address] [place], [place] [address]  Blood gas, arterial  Status: Abnormal   Collection Time: 02/25/18  5:49 AM  Result Value Ref Range   FIO2 1.00    Delivery systems VENTILATOR    Mode PRESSURE REGULATED VOLUME CONTROL    VT 500 mL   Peep/cpap 10.0 cm H20   pH, Arterial 7.32 (L) 7.350 - 7.450   pCO2 arterial 47 32.0 - 48.0 mmHg   pO2, Arterial 198 (H) 83.0 - 108.0 mmHg   Bicarbonate 24.2 20.0 - 28.0 mmol/L   Acid-base deficit 2.3 (H) 0.0 - 2.0 mmol/L   O2 Saturation 99.6 %   Patient temperature 37.0    Collection site LEFT RADIAL    Sample type ARTERIAL DRAW    Allens test (pass/fail) PASS PASS    Mechanical Rate 20     Comment: Performed at [facility], [address] [place], [place] [address]  Glucose, capillary     Status: None   Collection Time: 02/25/18  7:54 AM  Result Value Ref Range   Glucose-Capillary 86 70 - 99 mg/dL  Potassium     Status: None   Collection Time: 02/25/18 10:39 AM  Result Value Ref Range   Potassium 5.0 3.5 - 5.1 mmol/L    Comment: Performed at [facility], [address] [place], [place] [address]  Glucose, capillary     Status: Abnormal   Collection Time: 02/25/18 12:01 PM  Result Value Ref Range   Glucose-Capillary 66 (L) 70 - 99 mg/dL  Glucose, capillary     Status: Abnormal   Collection Time: 02/25/18 12:24 PM  Result Value Ref Range   Glucose-Capillary 111 (H) 70 - 99 mg/dL  Glucose, capillary     Status: None   Collection Time: 02/25/18  4:37 PM  Result Value Ref Range   Glucose-Capillary 99 70 - 99 mg/dL  Potassium     Status: None   Collection Time: 02/25/18  5:56 PM  Result Value Ref Range   Potassium 4.6 3.5 - 5.1 mmol/L    Comment: Performed at [facility], [address] [place], [place] [address]  Glucose, capillary     Status: Abnormal   Collection Time: 02/25/18  8:09 PM  Result Value Ref Range   Glucose-Capillary 157 (H) 70 - 99 mg/dL  Glucose, capillary     Status: Abnormal   Collection Time: 02/26/18 12:04 AM  Result Value Ref Range   Glucose-Capillary 112 (H) 70 - 99 mg/dL  Glucose, capillary     Status: Abnormal   Collection Time: 02/26/18  3:26 AM  Result Value Ref Range   Glucose-Capillary 136 (H) 70 - 99 mg/dL  Comprehensive metabolic panel     Status: Abnormal   Collection Time: 02/26/18  5:04 AM  Result Value Ref Range   Sodium 141 135 - 145 mmol/L   Potassium 4.4 3.5 - 5.1 mmol/L   Chloride 109 98 - 111 mmol/L   CO2 28 22 - 32 mmol/L   Glucose, Bld 141 (H) 70 - 99 mg/dL   BUN 13 6 - 20 mg/dL   Creatinine, Ser 0.78 0.61 - 1.24 mg/dL   Calcium 8.5 (L) 8.9 - 10.3 mg/dL    Total Protein 6.5 6.5 - 8.1 g/dL   Albumin 3.6 3.5 - 5.0 g/dL   AST 14 (L) 15 - 41 U/L   ALT 12 0 - 44 U/L   Alkaline Phosphatase 44 38 - 126 U/L   Total Bilirubin 0.4 0.3 - 1.2 mg/dL   GFR calc non Af Amer >60 >  60 mL/min   GFR calc Af Amer >60 >60 mL/min    Comment: (NOTE) The eGFR has been calculated using the CKD EPI equation. This calculation has not been validated in all clinical situations. eGFR's persistently <60 mL/min signify possible Chronic Kidney Disease.    Anion gap 4 (L) 5 - 15    Comment: Performed at [facility], [address] [place], [place] [address]  Magnesium     Status: None   Collection Time: 02/26/18  5:04 AM  Result Value Ref Range   Magnesium 2.2 1.7 - 2.4 mg/dL    Comment: Performed at [facility], [address] [place],  [address]  Glucose, capillary     Status: Abnormal   Collection Time: 02/26/18  7:24 AM  Result Value Ref Range   Glucose-Capillary 126 (H) 70 - 99 mg/dL  Glucose, capillary     Status: Abnormal   Collection Time: 02/26/18 12:09 PM  Result Value Ref Range   Glucose-Capillary 109 (H) 70 - 99 mg/dL  Glucose, capillary     Status: Abnormal   Collection Time: 02/26/18  4:00 PM  Result Value Ref Range   Glucose-Capillary 140 (H) 70 - 99 mg/dL    Current Facility-Administered Medications  Medication Dose Route Frequency Provider Last Rate Last Dose  . 0.9 %  sodium chloride infusion  250 mL Intravenous PRN Lance Coon, MD   Stopped at 02/25/18 [phone]  . enoxaparin (LOVENOX) injection 40 mg  40 mg Subcutaneous Q24H Bettey Costa, MD   40 mg at 02/25/18 2243  . ipratropium-albuterol (DUONEB) 0.5-2.5 (3) MG/3ML nebulizer solution 3 mL  3 mL Nebulization Q6H PRN Conforti, John, DO   3 mL at 02/26/18 1810  . multivitamin with minerals tablet 1 tablet  1 tablet Oral Daily Conforti, John, DO      . pantoprazole (PROTONIX) injection 40 mg  40 mg Intravenous Q12H Flora Lipps, MD   40 mg at 02/26/18 0910  . phenol  (CHLORASEPTIC) mouth spray 1 spray  1 spray Mouth/Throat PRN Conforti, John, DO   1 spray at 02/26/18 1830    Musculoskeletal: Strength & Muscle Tone: within Scott limits Gait & Station: Scott Patient leans: N/A  Psychiatric Specialty Exam: Physical Exam  Nursing note and vitals reviewed. Constitutional: He appears well-developed and well-nourished.  HENT:  Head: Normocephalic and atraumatic.  Eyes: Pupils are equal, round, and reactive to light. Conjunctivae are Scott.  Neck: Scott range of motion.  Cardiovascular: Scott heart sounds.  Respiratory: He is in respiratory distress.  GI: Soft.  Musculoskeletal: Scott range of motion.  Neurological: He is alert.  Skin: Skin is warm and dry.  Psychiatric: His affect is blunt. His speech is tangential. He is agitated. Thought content is paranoid. Cognition and memory are impaired. He expresses impulsivity. He expresses suicidal ideation.    Review of Systems  Constitutional: Negative.   HENT: Negative.   Eyes: Negative.   Respiratory: Positive for cough and sputum production.   Cardiovascular: Negative.   Gastrointestinal: Negative.   Musculoskeletal: Negative.   Skin: Negative.   Neurological: Negative.   Psychiatric/Behavioral: Positive for depression, hallucinations, memory loss, substance abuse and suicidal ideas. The patient is nervous/anxious and has insomnia.     Blood pressure (!) 146/88, pulse 98, temperature 98.8 F (37.1 C), temperature source Oral, resp. rate (!) 24, height 6' (1.829 m), weight 107.1 kg, SpO2 92 %.Body mass index is 32.02 kg/m.  General Appearance: Fairly Groomed  Eye Contact:  Fair  Speech:  Garbled  Volume:  Decreased  Mood:  Dysphoric and Irritable  Affect:  Congruent  Thought Process:  Goal Directed  Orientation:  Full (Time, Place, and Person)  Thought Content:  Illogical and Rumination  Suicidal Thoughts:  Yes.  with intent/plan  Homicidal Thoughts:  No  Memory:  Immediate;    Fair Recent;   Poor Remote;   Poor  Judgement:  Impaired  Insight:  Shallow  Psychomotor Activity:  Decreased  Concentration:  Concentration: Poor  Recall:  Poor  Fund of Knowledge:  Fair  Language:  Fair  Akathisia:  No  Handed:  Right  AIMS (if indicated):     Assets:  Chief Executive Officer Social Support  ADL's:  Impaired  Cognition:  Impaired,  Mild  Sleep:        Treatment Plan Summary: Daily contact with patient to assess and evaluate symptoms and progress in treatment, Medication management and Plan Patient with a diagnosis of schizophrenia also clearly with substance abuse problems.  Continues to endorse suicidal ideation.  Appears to have a chaotic course with a long history of noncompliance.  He is still under IVC and I will presume that he should be admitted to the psychiatric ward at this point.  Continue IV C.  Since he just overdosed on all of his antipsychotics I will hold off restarting any antipsychotics at this point.  Spoke with nursing.  Spoke with patient's father.  Disposition: Recommend psychiatric Inpatient admission when medically cleared. Supportive therapy provided about ongoing stressors.  Alethia Berthold, MD 02/26/2018 6:59 PM

## 2018-02-26 NOTE — Progress Notes (Signed)
Patients father Rosanne Ashing is having to leave to take patients mother back home because they feel the mother is possibly "triggering" the patients agitation. Father will return and would like to speak with the psychiatric or even talk to him on the phone if possible. Fathers number is [phone] Chanetta Marshall. Both parents were calm and cooperative and understand HIPPA; patient privacy.

## 2018-02-26 NOTE — Progress Notes (Signed)
Extubated after passing weaning trial.Became quite agitated and cyanotic.Non rebrathing mask applied with sats increasing to 95%Will continue to monitor

## 2018-02-26 NOTE — Progress Notes (Signed)
Pharmacy Electrolyte Monitoring Consult:  Pharmacy consulted to assist in monitoring and replacing electrolytes in this 27 y.o. male admitted on 02/24/2018 with Ingestion   Labs:  Sodium (mmol/L)  Date Value  02/26/2018 141   Potassium (mmol/L)  Date Value  02/26/2018 4.4   Magnesium (mg/dL)  Date Value  82/50/0370 2.2   Phosphorus (mg/dL)  Date Value  48/88/9169 4.0   Calcium (mg/dL)  Date Value  45/08/8880 8.5 (L)   Albumin (g/dL)  Date Value  80/08/4915 3.6    Assessment/Plan: Electrolytes WNL today. Patient does not require supplementation. Will order electrolytes with morning labs.  Pharmacy to continue to follow the patient and replace electrolytes per consult.  Pricilla Riffle, PharmD Pharmacy Resident  02/26/2018 3:20 PM

## 2018-02-26 NOTE — Progress Notes (Addendum)
[facility] [place] [facility]   PATIENT NAME: Scott Velasquez    MR#:  [id number]  DATE OF BIRTH:  1990-06-28  SUBJECTIVE:   Patient sitting up and has been extubated Currently denying suicidal ideation  REVIEW OF SYSTEMS:    Review of Systems  Constitutional: Negative for fever, chills weight loss HENT: Negative for ear pain, nosebleeds, congestion, facial swelling, rhinorrhea, neck pain, neck stiffness and ear discharge.   Respiratory: Negative for cough, shortness of breath, wheezing  Cardiovascular: Positive cough no shortness of breath Gastrointestinal: Negative for heartburn, abdominal pain, vomiting, diarrhea or consitpation Genitourinary: Negative for dysuria, urgency, frequency, hematuria Musculoskeletal: Negative for back pain or joint pain Neurological: Negative for dizziness, seizures, syncope, focal weakness,  numbness and headaches.  Hematological: Does not bruise/bleed easily.  Psychiatric/Behavioral: Good for hallucinations positive depression   Tolerating Diet: yes      DRUG ALLERGIES:  No Known Allergies  VITALS:  Blood pressure 118/88, pulse (!) 102, temperature 98.3 F (36.8 C), temperature source Oral, resp. rate 15, height 6' (1.829 m), weight 107.1 kg, SpO2 94 %.  PHYSICAL EXAMINATION:  Constitutional: Appears well-developed and well-nourished. No distress. HENT: Normocephalic. Marland Kitchen Oropharynx is clear and moist.  Eyes: Conjunctivae and EOM are normal. PERRLA, no scleral icterus.  Neck: Normal ROM. Neck supple. No JVD. No tracheal deviation. CVS: RRR, S1/S2 +, no murmurs, no gallops, no carotid bruit.  Pulmonary: Normal respiratory effort with coarse bilateral breath sounds Abdominal: Soft. BS +,  no distension, tenderness, rebound or guarding.  Musculoskeletal: Normal range of motion. No edema and no tenderness.  Neuro: Alert. CN 2-12 grossly intact. No focal deficits. Skin: Skin is warm and dry. No rash noted. Psychiatric:  Normal mood and affect.      LABORATORY PANEL:   CBC Recent Labs  Lab 02/25/18 0335  WBC 8.0  HGB 13.6  HCT 37.8*  PLT 238   ------------------------------------------------------------------------------------------------------------------  Chemistries  Recent Labs  Lab 02/26/18 0504  NA 141  K 4.4  CL 109  CO2 28  GLUCOSE 141*  BUN 13  CREATININE 0.78  CALCIUM 8.5*  MG 2.2  AST 14*  ALT 12  ALKPHOS 44  BILITOT 0.4   ------------------------------------------------------------------------------------------------------------------  Cardiac Enzymes Recent Labs  Lab 02/24/18 2229  TROPONINI <0.03   ------------------------------------------------------------------------------------------------------------------  RADIOLOGY:  Ct Head Wo Contrast  Result Date: 02/25/2018 CLINICAL DATA:  Head trauma, intracranial venous injury suspected Altered level of consciousness (LOC), unexplained; Facial trauma, fx suspected took overdose then fell and hit face on concrete; C-spine fx, traumatic. EXAM: CT HEAD WITHOUT CONTRAST CT MAXILLOFACIAL WITHOUT CONTRAST CT CERVICAL SPINE WITHOUT CONTRAST TECHNIQUE: Multidetector CT imaging of the head, cervical spine, and maxillofacial structures were performed using the standard protocol without intravenous contrast. Multiplanar CT image reconstructions of the cervical spine and maxillofacial structures were also generated. COMPARISON:  None. FINDINGS: CT HEAD FINDINGS Brain: No evidence of cerebral edema. No intracranial hemorrhage, mass effect, or midline shift. No hydrocephalus. The basilar cisterns are patent. No evidence of territorial infarct or acute ischemia. No extra-axial or intracranial fluid collection. Vascular: No hyperdense vessel or unexpected calcification. Skull: No fracture or focal lesion. Other: None. CT MAXILLOFACIAL FINDINGS Osseous: Nasal bone, zygomatic arches, and mandibles are intact. The temporomandibular joints are  congruent. Dental caries with periapical lucency about left upper second molar. Orbits: Both orbits and globes are intact.  No orbital fracture. Sinuses: Mucosal thickening throughout the paranasal sinuses with opacification of ethmoid air cells. No sinus fluid levels  or fracture. Enteric and endotracheal tubes in place. Soft tissues: Negative. CT CERVICAL SPINE FINDINGS Alignment: Slight left head tilt. Otherwise normal without traumatic subluxation. Skull base and vertebrae: No acute fracture. Vertebral body heights are maintained. The dens and skull base are intact. Soft tissues and spinal canal: No prevertebral fluid or swelling. No visible canal hematoma. Disc levels:  Normal. Upper chest: Negative. Other: None. IMPRESSION: 1. Negative head CT. 2. No facial bone fracture. Mucosal thickening of the paranasal sinuses may be secondary to intubation. Dental caries with periapical lucency of left upper second molar. 3. Negative CT of the cervical spine. Electronically Signed   By: Narda Rutherford M.D.   On: 02/25/2018 02:23   Ct Cervical Spine Wo Contrast  Result Date: 02/25/2018 CLINICAL DATA:  Head trauma, intracranial venous injury suspected Altered level of consciousness (LOC), unexplained; Facial trauma, fx suspected took overdose then fell and hit face on concrete; C-spine fx, traumatic. EXAM: CT HEAD WITHOUT CONTRAST CT MAXILLOFACIAL WITHOUT CONTRAST CT CERVICAL SPINE WITHOUT CONTRAST TECHNIQUE: Multidetector CT imaging of the head, cervical spine, and maxillofacial structures were performed using the standard protocol without intravenous contrast. Multiplanar CT image reconstructions of the cervical spine and maxillofacial structures were also generated. COMPARISON:  None. FINDINGS: CT HEAD FINDINGS Brain: No evidence of cerebral edema. No intracranial hemorrhage, mass effect, or midline shift. No hydrocephalus. The basilar cisterns are patent. No evidence of territorial infarct or acute ischemia. No  extra-axial or intracranial fluid collection. Vascular: No hyperdense vessel or unexpected calcification. Skull: No fracture or focal lesion. Other: None. CT MAXILLOFACIAL FINDINGS Osseous: Nasal bone, zygomatic arches, and mandibles are intact. The temporomandibular joints are congruent. Dental caries with periapical lucency about left upper second molar. Orbits: Both orbits and globes are intact.  No orbital fracture. Sinuses: Mucosal thickening throughout the paranasal sinuses with opacification of ethmoid air cells. No sinus fluid levels or fracture. Enteric and endotracheal tubes in place. Soft tissues: Negative. CT CERVICAL SPINE FINDINGS Alignment: Slight left head tilt. Otherwise normal without traumatic subluxation. Skull base and vertebrae: No acute fracture. Vertebral body heights are maintained. The dens and skull base are intact. Soft tissues and spinal canal: No prevertebral fluid or swelling. No visible canal hematoma. Disc levels:  Normal. Upper chest: Negative. Other: None. IMPRESSION: 1. Negative head CT. 2. No facial bone fracture. Mucosal thickening of the paranasal sinuses may be secondary to intubation. Dental caries with periapical lucency of left upper second molar. 3. Negative CT of the cervical spine. Electronically Signed   By: Narda Rutherford M.D.   On: 02/25/2018 02:23   Dg Chest Port 1 View  Result Date: 02/26/2018 CLINICAL DATA:  Hypoxia EXAM: PORTABLE CHEST 1 VIEW COMPARISON:  February 25, 2018 FINDINGS: Endotracheal tube tip is 3.6 cm above the carina. Nasogastric tube tip and side port are in the stomach. No pneumothorax. There is consolidation in the left lower lobe as well as in the medial right base. There are small pleural effusions bilaterally. Lungs elsewhere clear. Heart is mildly enlarged with pulmonary vascularity normal. No adenopathy. No bone lesions. IMPRESSION: Tube positions as described without pneumothorax. Airspace consolidation left lower lobe and medial  right base with small pleural effusions bilaterally. Lungs elsewhere clear. Stable cardiac prominence. Electronically Signed   By: Bretta Bang III M.D.   On: 02/26/2018 07:02   Dg Chest Portable 1 View  Result Date: 02/25/2018 CLINICAL DATA:  27 y/o  M; status post intubation. EXAM: PORTABLE CHEST 1 VIEW COMPARISON:  02/24/2018 chest  radiograph FINDINGS: Stable normal cardiac silhouette given projection and technique. Endotracheal tube tip projects 3.1 cm above the carina. Enteric tube tip extends below the field of view into the abdomen. Clear lungs. No pleural effusion or pneumothorax. No acute osseous abnormality is evident. IMPRESSION: 1. Endotracheal tube tip projects 3.1 cm above the carina. Enteric tube tip is below field of view in the abdomen. 2. No acute pulmonary process identified. Electronically Signed   By: Mitzi Hansen M.D.   On: 02/25/2018 01:01   Dg Chest Portable 1 View  Result Date: 02/25/2018 CLINICAL DATA:  Cough and short of breath overdose EXAM: PORTABLE CHEST 1 VIEW COMPARISON:  None. FINDINGS: Borderline heart size. No acute opacity or pleural effusion. No pneumothorax. IMPRESSION: No active disease. Electronically Signed   By: Jasmine Pang M.D.   On: 02/25/2018 00:05   Dg Abd Portable 1v  Result Date: 02/25/2018 CLINICAL DATA:  OG tube placement EXAM: PORTABLE ABDOMEN - 1 VIEW COMPARISON:  None. FINDINGS: Orogastric tube with the tip projecting over the stomach. There is no bowel dilatation to suggest obstruction. There is no evidence of pneumoperitoneum, portal venous gas or pneumatosis. There are no pathologic calcifications along the expected course of the ureters. The osseous structures are unremarkable. IMPRESSION: Orogastric tube with the tip projecting over the stomach. Electronically Signed   By: Elige Ko   On: 02/25/2018 13:01   Ct Maxillofacial Wo Contrast  Result Date: 02/25/2018 CLINICAL DATA:  Head trauma, intracranial venous injury  suspected Altered level of consciousness (LOC), unexplained; Facial trauma, fx suspected took overdose then fell and hit face on concrete; C-spine fx, traumatic. EXAM: CT HEAD WITHOUT CONTRAST CT MAXILLOFACIAL WITHOUT CONTRAST CT CERVICAL SPINE WITHOUT CONTRAST TECHNIQUE: Multidetector CT imaging of the head, cervical spine, and maxillofacial structures were performed using the standard protocol without intravenous contrast. Multiplanar CT image reconstructions of the cervical spine and maxillofacial structures were also generated. COMPARISON:  None. FINDINGS: CT HEAD FINDINGS Brain: No evidence of cerebral edema. No intracranial hemorrhage, mass effect, or midline shift. No hydrocephalus. The basilar cisterns are patent. No evidence of territorial infarct or acute ischemia. No extra-axial or intracranial fluid collection. Vascular: No hyperdense vessel or unexpected calcification. Skull: No fracture or focal lesion. Other: None. CT MAXILLOFACIAL FINDINGS Osseous: Nasal bone, zygomatic arches, and mandibles are intact. The temporomandibular joints are congruent. Dental caries with periapical lucency about left upper second molar. Orbits: Both orbits and globes are intact.  No orbital fracture. Sinuses: Mucosal thickening throughout the paranasal sinuses with opacification of ethmoid air cells. No sinus fluid levels or fracture. Enteric and endotracheal tubes in place. Soft tissues: Negative. CT CERVICAL SPINE FINDINGS Alignment: Slight left head tilt. Otherwise normal without traumatic subluxation. Skull base and vertebrae: No acute fracture. Vertebral body heights are maintained. The dens and skull base are intact. Soft tissues and spinal canal: No prevertebral fluid or swelling. No visible canal hematoma. Disc levels:  Normal. Upper chest: Negative. Other: None. IMPRESSION: 1. Negative head CT. 2. No facial bone fracture. Mucosal thickening of the paranasal sinuses may be secondary to intubation. Dental caries  with periapical lucency of left upper second molar. 3. Negative CT of the cervical spine. Electronically Signed   By: Narda Rutherford M.D.   On: 02/25/2018 02:23     ASSESSMENT AND PLAN:   27 year old male who was admitted to the hospital for intentional drug overdose with suicidal attempt on Abilify and Haldol requiring intubation for airway protection.  1.  Acute hypoxic respiratory  failure due to suicidal attempt and inability to protect airway: Patient has now been extubated  2.  Depression/suicidal attempt: Continue sitter Psychiatry evaluation  3.  QTC prolongation due to Haldol: Continue to monitor per poison control  4. Tobacco dependence: Patient is encouraged to quit smoking. Counseling was provided for 4 minutes.  Discussed with intensivist   Management plans discussed with the patient and he is in agreement.  CODE STATUS:  full  TOTAL TIME TAKING CARE OF THIS PATIENT: 29 minutes.     POSSIBLE D/C 1-3 days, DEPENDING ON CLINICAL CONDITION.   Kirke Breach M.D on 02/26/2018 at 12:29 PM  Between 7am to 6pm - Pager - [phone] After 6pm go to [email or web address] - password [email or web address]  [facility] [place] [facility]  Office  [phone]  CC: Primary care physician; Patient, No Pcp Per  Note: This dictation was prepared with Dragon dictation along with smaller phrase technology. Any transcriptional errors that result from this process are unintentional.

## 2018-02-27 ENCOUNTER — Inpatient Hospital Stay: Payer: Medicare Other

## 2018-02-27 LAB — MAGNESIUM: Magnesium: 2 mg/dL (ref 1.7–2.4)

## 2018-02-27 LAB — BASIC METABOLIC PANEL
Anion gap: 7 (ref 5–15)
BUN: 11 mg/dL (ref 6–20)
CO2: 30 mmol/L (ref 22–32)
Calcium: 8.8 mg/dL — ABNORMAL LOW (ref 8.9–10.3)
Chloride: 102 mmol/L (ref 98–111)
Creatinine, Ser: 0.74 mg/dL (ref 0.61–1.24)
GFR calc Af Amer: >60 mL/min (ref >60)
GFR calc non Af Amer: >60 mL/min (ref >60)
Glucose, Bld: 89 mg/dL (ref 70–99)
Potassium: 3.3 mmol/L — ABNORMAL LOW (ref 3.5–5.1)
Sodium: 139 mmol/L (ref 135–145)

## 2018-02-27 MED ORDER — ENSURE ENLIVE PO LIQD
237.0000 mL | Freq: Two times a day (BID) | ORAL | Status: DC
  Administered 2018-02-28 (×2): 237 mL via ORAL

## 2018-02-27 MED ORDER — PANTOPRAZOLE SODIUM 40 MG PO TBEC
40.0000 mg | DELAYED_RELEASE_TABLET | Freq: Every day | ORAL | Status: DC
  Administered 2018-02-28 – 2018-03-01 (×2): 40 mg via ORAL
  Filled 2018-02-27 (×2): qty 1

## 2018-02-27 MED ORDER — AMOXICILLIN-POT CLAVULANATE 875-125 MG PO TABS
1.0000 | ORAL_TABLET | Freq: Two times a day (BID) | ORAL | Status: DC
  Administered 2018-02-27 – 2018-03-01 (×5): 1 via ORAL
  Filled 2018-02-27 (×5): qty 1

## 2018-02-27 MED ORDER — POTASSIUM CHLORIDE CRYS ER 20 MEQ PO TBCR
40.0000 meq | EXTENDED_RELEASE_TABLET | Freq: Once | ORAL | Status: AC
  Administered 2018-02-27: 10:00:00 40 meq via ORAL
  Filled 2018-02-27: qty 2

## 2018-02-27 MED ORDER — FUROSEMIDE 10 MG/ML IJ SOLN
40.0000 mg | Freq: Once | INTRAMUSCULAR | Status: AC
  Administered 2018-02-27: 14:00:00 40 mg via INTRAVENOUS
  Filled 2018-02-27: qty 4

## 2018-02-27 MED ORDER — LORAZEPAM 2 MG/ML IJ SOLN
INTRAMUSCULAR | Status: AC
  Filled 2018-02-27: qty 1

## 2018-02-27 MED ORDER — LIDOCAINE VISCOUS HCL 2 % MT SOLN
15.0000 mL | Freq: Once | OROMUCOSAL | Status: DC
  Filled 2018-02-27: qty 15

## 2018-02-27 MED ORDER — LORAZEPAM 2 MG/ML IJ SOLN: 2.0000 mg | INTRAMUSCULAR | Status: DC | PRN

## 2018-02-27 MED ORDER — POTASSIUM CHLORIDE CRYS ER 20 MEQ PO TBCR: 40.0000 meq | EXTENDED_RELEASE_TABLET | Freq: Once | ORAL | Status: DC

## 2018-02-27 NOTE — Progress Notes (Signed)
[facility] - [facility] [place] [facility]   PATIENT NAME: Scott Velasquez    MR#:  [id number]  DATE OF BIRTH:  06/16/1991  SUBJECTIVE:   Patient seen and evaluated today Status post extubation yesterday Transferred to medical floor Still has some cough Currently on oxygen via nasal cannula at 3 L No fever   REVIEW OF SYSTEMS:    Review of Systems  Constitutional: Negative for fever, chills weight loss HENT: Negative for ear pain, nosebleeds, has congestion, no facial swelling, rhinorrhea, neck pain, neck stiffness and ear discharge.  Throat pain on and off Respiratory: Has cough , no shortness of breath, wheezing  Cardiovascular: Positive cough no shortness of breath Gastrointestinal: Negative for heartburn, abdominal pain, vomiting, diarrhea or consitpation Genitourinary: Negative for dysuria, urgency, frequency, hematuria Musculoskeletal: Negative for back pain or joint pain Neurological: Negative for dizziness, seizures, syncope, focal weakness,  numbness and headaches.  Hematological: Does not bruise/bleed easily.  Psychiatric/Behavioral: Good for hallucinations positive depression   Tolerating Diet: yes      DRUG ALLERGIES:  No Known Allergies  VITALS:  Blood pressure (!) 145/75, pulse 97, temperature 98.4 F (36.9 C), temperature source Oral, resp. rate 18, height 6' (1.829 m), weight 107.1 kg, SpO2 92 %.  PHYSICAL EXAMINATION:  Constitutional: Appears well-developed and well-nourished. No distress. HENT: Normocephalic. Marland Kitchen Oropharynx is clear and moist.  Eyes: Conjunctivae and EOM are normal. PERRLA, no scleral icterus.  Neck: Normal ROM. Neck supple. No JVD. No tracheal deviation. CVS: RRR, S1/S2 +, no murmurs, no gallops, no carotid bruit.  Pulmonary: Normal respiratory effort with coarse bilateral breath sounds Abdominal: Soft. BS +,  no distension, tenderness, rebound or guarding.  Musculoskeletal: Normal range of motion. No edema and no  tenderness.  Neuro: Alert. CN 2-12 grossly intact. No focal deficits. Skin: Skin is warm and dry. No rash noted. Psychiatric: Normal mood and affect.      LABORATORY PANEL:   CBC Recent Labs  Lab 02/25/18 0335  WBC 8.0  HGB 13.6  HCT 37.8*  PLT 238   ------------------------------------------------------------------------------------------------------------------  Chemistries  Recent Labs  Lab 02/26/18 0504 02/27/18 0423  NA 141 139  K 4.4 3.3*  CL 109 102  CO2 28 30  GLUCOSE 141* 89  BUN 13 11  CREATININE 0.78 0.74  CALCIUM 8.5* 8.8*  MG 2.2 2.0  AST 14*  --   ALT 12  --   ALKPHOS 44  --   BILITOT 0.4  --    ------------------------------------------------------------------------------------------------------------------  Cardiac Enzymes Recent Labs  Lab 02/24/18 2229  TROPONINI <0.03   ------------------------------------------------------------------------------------------------------------------  RADIOLOGY:  Dg Chest 2 View  Result Date: 02/27/2018 CLINICAL DATA:  Shortness of breath, productive cough EXAM: CHEST - 2 VIEW COMPARISON:  02/26/2018 FINDINGS: Cardiomegaly. Diffuse interstitial prominence throughout the lungs, most pronounced in the lower lungs. No effusions. No acute bony abnormality. IMPRESSION: Diffuse interstitial prominence, most confluent in the lower lobes with mild cardiomegaly. This could represent edema or atypical infection. Electronically Signed   By: Charlett Nose M.D.   On: 02/27/2018 10:54   Dg Chest Port 1 View  Result Date: 02/26/2018 CLINICAL DATA:  Hypoxia EXAM: PORTABLE CHEST 1 VIEW COMPARISON:  February 25, 2018 FINDINGS: Endotracheal tube tip is 3.6 cm above the carina. Nasogastric tube tip and side port are in the stomach. No pneumothorax. There is consolidation in the left lower lobe as well as in the medial right base. There are small pleural effusions bilaterally. Lungs elsewhere clear. Heart is mildly  enlarged with  pulmonary vascularity normal. No adenopathy. No bone lesions. IMPRESSION: Tube positions as described without pneumothorax. Airspace consolidation left lower lobe and medial right base with small pleural effusions bilaterally. Lungs elsewhere clear. Stable cardiac prominence. Electronically Signed   By: Bretta BangWilliam  Woodruff III M.D.   On: 02/26/2018 07:02     ASSESSMENT AND PLAN:   27 year old male who was admitted to the hospital for intentional drug overdose with suicidal attempt on Abilify and Haldol requiring intubation for airway protection.  1.  Status post hypoxic respiratory failure due to suicidal attempt and inability to protect airway: Patient has now been extubated Off ventilator and transferred to medical floor Wean patient off oxygen via nasal cannula as feasible  2.  Depression/suicidal attempt: Continue sitter and one-on-one observation Psychiatry evaluation and follow-up appreciated  3.  QTC prolongation due to Haldol: Continue to monitor per poison control  4.  Acute bronchitis versus pulmonary vascular congestion Start patient on oral Augmentin IV Lasix 40 mg 1 dose for diuresis  5.  Acute hypokalemia Replace potassium orally  6. Tobacco dependence: Patient is encouraged to quit smoking. Counseling was provided for 4 minutes.    Management plans discussed with the patient and he is in agreement.  CODE STATUS:  full  TOTAL TIME TAKING CARE OF THIS PATIENT: 33 minutes.     POSSIBLE D/C 2-3 days, DEPENDING ON CLINICAL CONDITION.   Ihor AustinPavan Farheen Pfahler M.D on 02/27/2018 at 12:57 PM  Between 7am to 6pm - Pager - [phone] After 6pm go to [email or web address] - password [email or web address]  [facility] [place] [facility]  Office  [phone]  CC: Primary care physician; Patient, No Pcp Per  Note: This dictation was prepared with Dragon dictation along with smaller phrase technology. Any transcriptional errors that result from this process are unintentional.

## 2018-02-27 NOTE — Progress Notes (Addendum)
Nutrition Follow-up  DOCUMENTATION CODES:   Obesity unspecified  INTERVENTION:  Ensure Enlive BID (each supplement provides 350 kcal and 20 grams of protein)   Continue MVI daily  NUTRITION DIAGNOSIS:   Inadequate oral intake related to acute illness(OD; suicidal ingestion) as evidenced by meal completion < 50%.   GOAL:   Patient will meet greater than or equal to 90% of their needs Not met  MONITOR:   PO intake, Labs, Weight trends   ASSESSMENT:   27 year old male with PMHx of previous suicide attempts admitted for intentional drug overdose (suicide attempt) with Haldol and Abilify requiring intubation on 9/10 for airway protection.   Pt extubated 9/11 and put on regular diet. Upon visiting the pt breakfast plate was sitting on the table with less than 50% completion. Pt appeared calm. He reports that prior to admission his appetite was good and weight has been stable. He reports eating three meals a day and that he has no trouble obtaining food to eat. Per previous physical exam, pt is well-nourished with no depletions. Ensure supplement recommended to meet his needs until po intake improves.   Medications reviewed and include: MVI, ativan, protonix, lovenox Labs reviewed: potassium 3.3 (L), calcium 8.8 (L)  Diet Order:   Diet Order            Diet regular Room service appropriate? Yes; Fluid consistency: Thin  Diet effective now              EDUCATION NEEDS:   No education needs have been identified at this time  Skin:  Skin Assessment: Reviewed RN Assessment  Last BM:  9/11  Height:   Ht Readings from Last 1 Encounters:  02/25/18 6' (1.829 m)    Weight:   Wt Readings from Last 1 Encounters:  02/26/18 107.1 kg    Ideal Body Weight:  81 kg  BMI:  Body mass index is 32.02 kg/m.  Estimated Nutritional Needs:   Kcal:  1610-9604  Protein:  120-130  Fluid:  >2.5 L    Haevyn Ury, Dietetic Intern [phone]

## 2018-02-27 NOTE — Progress Notes (Signed)
Pharmacy Electrolyte Monitoring Consult:  Pharmacy consulted to assist in monitoring and replacing electrolytes in this 27 y.o. male admitted on 02/24/2018 with Ingestion   Labs:  Sodium (mmol/L)  Date Value  02/27/2018 139   Potassium (mmol/L)  Date Value  02/27/2018 3.3 (L)   Magnesium (mg/dL)  Date Value  40/98/119109/05/2018 2.0   Phosphorus (mg/dL)  Date Value  47/82/956209/03/2018 4.0   Calcium (mg/dL)  Date Value  13/08/657809/05/2018 8.8 (L)   Albumin (g/dL)  Date Value  46/96/295209/04/2018 3.6    Assessment/Plan: 9/12 K: 3.3. Will supplement with 40mEq x 1 dose.   Will order electrolytes with morning labs.  Pharmacy to continue to follow the patient and replace electrolytes per consult.  Gardner CandleSheema M Bellina Tokarczyk, PharmD, BCPS Clinical Pharmacist 02/27/2018 7:24 AM

## 2018-02-28 LAB — CBC
HCT: 40.4 % (ref 40.0–52.0)
Hemoglobin: 14.2 g/dL (ref 13.0–18.0)
MCH: 31.6 pg (ref 26.0–34.0)
MCHC: 35.1 g/dL (ref 32.0–36.0)
MCV: 90.1 fL (ref 80.0–100.0)
Platelets: 249 10*3/uL (ref 150–440)
RBC: 4.48 MIL/uL (ref 4.40–5.90)
RDW: 12.5 % (ref 11.5–14.5)
WBC: 7.3 10*3/uL (ref 3.8–10.6)

## 2018-02-28 LAB — BASIC METABOLIC PANEL
Anion gap: 13 (ref 5–15)
BUN: 12 mg/dL (ref 6–20)
CO2: 28 mmol/L (ref 22–32)
Calcium: 8.5 mg/dL — ABNORMAL LOW (ref 8.9–10.3)
Chloride: 97 mmol/L — ABNORMAL LOW (ref 98–111)
Creatinine, Ser: 0.82 mg/dL (ref 0.61–1.24)
GFR calc Af Amer: >60 mL/min (ref >60)
GFR calc non Af Amer: >60 mL/min (ref >60)
Glucose, Bld: 91 mg/dL (ref 70–99)
Potassium: 3.1 mmol/L — ABNORMAL LOW (ref 3.5–5.1)
Sodium: 138 mmol/L (ref 135–145)

## 2018-02-28 LAB — MAGNESIUM: Magnesium: 1.9 mg/dL (ref 1.7–2.4)

## 2018-02-28 LAB — PROCALCITONIN: Procalcitonin: 0.13 ng/mL

## 2018-02-28 MED ORDER — FUROSEMIDE 10 MG/ML IJ SOLN
20.0000 mg | Freq: Once | INTRAMUSCULAR | Status: AC
  Administered 2018-02-28: 16:00:00 20 mg via INTRAVENOUS
  Filled 2018-02-28: qty 2

## 2018-02-28 MED ORDER — POTASSIUM CHLORIDE CRYS ER 20 MEQ PO TBCR
40.0000 meq | EXTENDED_RELEASE_TABLET | ORAL | Status: DC
  Administered 2018-02-28: 11:00:00 40 meq via ORAL
  Filled 2018-02-28: qty 2

## 2018-02-28 MED ORDER — POTASSIUM CHLORIDE CRYS ER 20 MEQ PO TBCR
40.0000 meq | EXTENDED_RELEASE_TABLET | Freq: Once | ORAL | Status: AC
  Administered 2018-02-28: 40 meq via ORAL
  Filled 2018-02-28: qty 2

## 2018-02-28 MED ORDER — POTASSIUM CHLORIDE CRYS ER 20 MEQ PO TBCR: 40.0000 meq | EXTENDED_RELEASE_TABLET | Freq: Once | ORAL | Status: DC

## 2018-02-28 NOTE — Progress Notes (Signed)
[employer] [facility] - [place] [facility]   PATIENT NAME: Scott LarsenBritt Velasquez    MR#:  [id number]  DATE OF BIRTH:  01/15/1991  SUBJECTIVE:   Patient seen and evaluated today Status post extubation day 3 Has decreased cough Currently on oxygen via nasal cannula at 1liter No fever   REVIEW OF SYSTEMS:    Review of Systems  Constitutional: Negative for fever, chills weight loss HENT: Negative for ear pain, nosebleeds, has congestion, no facial swelling, rhinorrhea, neck pain, neck stiffness and ear discharge.  Throat pain on and off Respiratory: Has decreased cough , no shortness of breath, wheezing  Cardiovascular: Positive cough no shortness of breath Gastrointestinal: Negative for heartburn, abdominal pain, vomiting, diarrhea or consitpation Genitourinary: Negative for dysuria, urgency, frequency, hematuria Musculoskeletal: Negative for back pain or joint pain Neurological: Negative for dizziness, seizures, syncope, focal weakness,  numbness and headaches.  Hematological: Does not bruise/bleed easily.  Psychiatric/Behavioral: Good for hallucinations positive depression   Tolerating Diet: yes      DRUG ALLERGIES:  No Known Allergies  VITALS:  Blood pressure (!) 146/77, pulse (!) 107, temperature 98.6 F (37 C), temperature source Oral, resp. rate 20, height 6' (1.829 m), weight 106.4 kg, SpO2 91 %.  PHYSICAL EXAMINATION:  Constitutional: Appears well-developed and well-nourished. No distress. HENT: Normocephalic. Marland Kitchen. Oropharynx is clear and moist.  Eyes: Conjunctivae and EOM are normal. PERRLA, no scleral icterus.  Neck: Normal ROM. Neck supple. No JVD. No tracheal deviation. CVS: RRR, S1/S2 +, no murmurs, no gallops, no carotid bruit.  Pulmonary: Normal respiratory effort with coarse bilateral breath sounds Abdominal: Soft. BS +,  no distension, tenderness, rebound or guarding.  Musculoskeletal: Normal range of motion. No edema and no tenderness.  Neuro: Alert.  CN 2-12 grossly intact. No focal deficits. Skin: Skin is warm and dry. No rash noted. Psychiatric: Normal mood and affect.      LABORATORY PANEL:   CBC Recent Labs  Lab 02/28/18 0534  WBC 7.3  HGB 14.2  HCT 40.4  PLT 249   ------------------------------------------------------------------------------------------------------------------  Chemistries  Recent Labs  Lab 02/26/18 0504  02/28/18 0534  NA 141   < > 138  K 4.4   < > 3.1*  CL 109   < > 97*  CO2 28   < > 28  GLUCOSE 141*   < > 91  BUN 13   < > 12  CREATININE 0.78   < > 0.82  CALCIUM 8.5*   < > 8.5*  MG 2.2   < > 1.9  AST 14*  --   --   ALT 12  --   --   ALKPHOS 44  --   --   BILITOT 0.4  --   --    < > = values in this interval not displayed.   ------------------------------------------------------------------------------------------------------------------  Cardiac Enzymes Recent Labs  Lab 02/24/18 2229  TROPONINI <0.03   ------------------------------------------------------------------------------------------------------------------  RADIOLOGY:  Dg Chest 2 View  Result Date: 02/27/2018 CLINICAL DATA:  Shortness of breath, productive cough EXAM: CHEST - 2 VIEW COMPARISON:  02/26/2018 FINDINGS: Cardiomegaly. Diffuse interstitial prominence throughout the lungs, most pronounced in the lower lungs. No effusions. No acute bony abnormality. IMPRESSION: Diffuse interstitial prominence, most confluent in the lower lobes with mild cardiomegaly. This could represent edema or atypical infection. Electronically Signed   By: Charlett NoseKevin  Dover M.D.   On: 02/27/2018 10:54     ASSESSMENT AND PLAN:   [age] year old male who was admitted to the hospital for  intentional drug overdose with suicidal attempt on Abilify and Haldol requiring intubation for airway protection.  1.  Status post hypoxic respiratory failure due to suicidal attempt and inability to protect airway: Patient has now been extubated Off ventilator and  transferred to medical floor Wean patient off oxygen via nasal cannula as feasible  2.  Depression/suicidal attempt: Continue sitter and one-on-one observation Psychiatry evaluation and follow-up appreciated Discharged to inpatient behavioral unit once medically stable  3.  QTC prolongation due to Haldol: Continue to monitor per poison control  4.  Acute bronchitis versus pulmonary vascular congestion Start patient on oral Augmentin IV Lasix 40 mg 1 dose for diuresis  5.  Acute hypokalemia Replace potassium orally Follow potassium level  6. Tobacco dependence: Patient is encouraged to quit smoking. Counseling was provided for 4 minutes.    Management plans discussed with the patient and he is in agreement.  CODE STATUS:  full  TOTAL TIME TAKING CARE OF THIS PATIENT: 33 minutes.     POSSIBLE D/C 2-3 days, DEPENDING ON CLINICAL CONDITION.   Ihor Austin M.D on 02/28/2018 at 1:46 PM  Between 7am to 6pm - Pager - [phone] After 6pm go to [email or web address] - password [email or web address]  [facility]  Office  [phone]  CC: Primary care physician; Patient, No Pcp Per  Note: This dictation was prepared with Dragon dictation along with smaller phrase technology. Any transcriptional errors that result from this process are unintentional.

## 2018-02-28 NOTE — Progress Notes (Signed)
Pharmacy Electrolyte Monitoring Consult:  Pharmacy consulted to assist in monitoring and replacing electrolytes in this 27 y.o. male admitted on 02/24/2018 with Ingestion   Labs:  Sodium (mmol/L)  Date Value  02/28/2018 138   Potassium (mmol/L)  Date Value  02/28/2018 3.1 (L)   Magnesium (mg/dL)  Date Value  40/98/119109/05/2018 2.0   Phosphorus (mg/dL)  Date Value  47/82/956209/03/2018 4.0   Calcium (mg/dL)  Date Value  13/08/657809/13/2019 8.5 (L)   Albumin (g/dL)  Date Value  46/96/295209/04/2018 3.6    Assessment/Plan: 9/12 K: 3.1. Will supplement with 40mEq every 4 hours x 2 doses.   Will order electrolytes with morning labs.  Pharmacy to continue to follow the patient and replace electrolytes per consult.  Gardner CandleSheema M Jaysha Lasure, PharmD, BCPS Clinical Pharmacist 02/28/2018 7:44 AM

## 2018-02-28 NOTE — Consult Note (Signed)
[place] Psychiatry Consult   Reason for Consult: Follow-up consult for this patient who is in the hospital after a serious suicide attempt Referring Physician: Pyreddy Patient Identification: Scott Velasquez MRN:  [id number] Principal Diagnosis: Suicide attempt [facility]) Diagnosis:   Patient Active Problem List   Diagnosis Date Noted  . Schizophrenia ([place]) [F20.9] 02/26/2018  . Amphetamine abuse ([place]) [F15.10] 02/26/2018  . Marijuana abuse [F12.10] 02/26/2018  . Suicide attempt ([place]) [T14.91XA] 02/25/2018  . Drug overdose [T50.901A] 02/25/2018    Total Time spent with patient: 20 minutes  Subjective:   Scott Velasquez is a 27 y.o. male patient admitted with "I took a lot of pills".  HPI: Patient interviewed chart reviewed.  Patient is now better able to cooperate.  He admits that he took a large amount of medication in a suicide attempt because he wanted to die.  He is minimizing the consequences of it today and says he feels okay today.  Denies any current hallucinations.  Patient continues to have low oxygen saturations which has meant that he continues to be on a liter to a liter and a half of oxygen  Past Psychiatric History: Long history of schizophrenia and drug abuse  Risk to Self:   Risk to Others:   Prior Inpatient Therapy:   Prior Outpatient Therapy:    Past Medical History:  Past Medical History:  Diagnosis Date  . Suicide attempt [facility])     Past Surgical History:  Procedure Laterality Date  . NO PAST SURGERIES     Family History: History reviewed. No pertinent family history. Family Psychiatric  History: See previous note Social History:  Social History   Substance and Sexual Activity  Alcohol Use Not Currently     Social History   Substance and Sexual Activity  Drug Use Yes  . Types: Methamphetamines    Social History   Socioeconomic History  . Marital status: Single    Spouse name: Not on file  . Number of children: Not on file  . Years of  education: Not on file  . Highest education level: Not on file  Occupational History  . Not on file  Social Needs  . Financial resource strain: Not on file  . Food insecurity:    Worry: Not on file    Inability: Not on file  . Transportation needs:    Medical: Not on file    Non-medical: Not on file  Tobacco Use  . Smoking status: Current Every Day Smoker    Packs/day: 1.00  . Smokeless tobacco: Current User  Substance and Sexual Activity  . Alcohol use: Not Currently  . Drug use: Yes    Types: Methamphetamines  . Sexual activity: Not Currently  Lifestyle  . Physical activity:    Days per week: Not on file    Minutes per session: Not on file  . Stress: Not on file  Relationships  . Social connections:    Talks on phone: Not on file    Gets together: Not on file    Attends religious service: Not on file    Active member of club or organization: Not on file    Attends meetings of clubs or organizations: Not on file    Relationship status: Not on file  Other Topics Concern  . Not on file  Social History Narrative  . Not on file   Additional Social History:    Allergies:  No Known Allergies  Labs:  Results for orders placed or performed during the  hospital encounter of 02/24/18 (from the past 48 hour(s))  Glucose, capillary     Status: Abnormal   Collection Time: 02/26/18  8:37 PM  Result Value Ref Range   Glucose-Capillary 114 (H) 70 - 99 mg/dL  Basic metabolic panel     Status: Abnormal   Collection Time: 02/27/18  4:23 AM  Result Value Ref Range   Sodium 139 135 - 145 mmol/L   Potassium 3.3 (L) 3.5 - 5.1 mmol/L   Chloride 102 98 - 111 mmol/L   CO2 30 22 - 32 mmol/L   Glucose, Bld 89 70 - 99 mg/dL   BUN 11 6 - 20 mg/dL   Creatinine, Ser 0.74 0.61 - 1.24 mg/dL   Calcium 8.8 (L) 8.9 - 10.3 mg/dL   GFR calc non Af Amer >60 >60 mL/min   GFR calc Af Amer >60 >60 mL/min    Comment: (NOTE) The eGFR has been calculated using the CKD EPI equation. This  calculation has not been validated in all clinical situations. eGFR's persistently <60 mL/min signify possible Chronic Kidney Disease.    Anion gap 7 5 - 15    Comment: Performed at [facility], [address] [place], [place] [address]  Magnesium     Status: None   Collection Time: 02/27/18  4:23 AM  Result Value Ref Range   Magnesium 2.0 1.7 - 2.4 mg/dL    Comment: Performed at [facility], [address] [place], [place] [address]  Basic metabolic panel     Status: Abnormal   Collection Time: 02/28/18  5:34 AM  Result Value Ref Range   Sodium 138 135 - 145 mmol/L   Potassium 3.1 (L) 3.5 - 5.1 mmol/L   Chloride 97 (L) 98 - 111 mmol/L   CO2 28 22 - 32 mmol/L   Glucose, Bld 91 70 - 99 mg/dL   BUN 12 6 - 20 mg/dL   Creatinine, Ser 0.82 0.61 - 1.24 mg/dL   Calcium 8.5 (L) 8.9 - 10.3 mg/dL   GFR calc non Af Amer >60 >60 mL/min   GFR calc Af Amer >60 >60 mL/min    Comment: (NOTE) The eGFR has been calculated using the CKD EPI equation. This calculation has not been validated in all clinical situations. eGFR's persistently <60 mL/min signify possible Chronic Kidney Disease.    Anion gap 13 5 - 15    Comment: Performed at [facility], [address] [place], [place] [address]  Magnesium     Status: None   Collection Time: 02/28/18  5:34 AM  Result Value Ref Range   Magnesium 1.9 1.7 - 2.4 mg/dL    Comment: Performed at [facility], [address] [place], [place] [address]  Procalcitonin - Baseline     Status: None   Collection Time: 02/28/18  5:34 AM  Result Value Ref Range   Procalcitonin 0.13 ng/mL    Comment:        Interpretation: PCT (Procalcitonin) <= 0.5 ng/mL: Systemic infection (sepsis) is not likely. Local bacterial infection is possible. (NOTE)       Sepsis PCT Algorithm           Lower Respiratory Tract                                      Infection PCT Algorithm    ----------------------------      ----------------------------  PCT < 0.25 ng/mL                PCT < 0.10 ng/mL         Strongly encourage             Strongly discourage   discontinuation of antibiotics    initiation of antibiotics    ----------------------------     -----------------------------       PCT 0.25 - 0.50 ng/mL            PCT 0.10 - 0.25 ng/mL               OR       >80% decrease in PCT            Discourage initiation of                                            antibiotics      Encourage discontinuation           of antibiotics    ----------------------------     -----------------------------         PCT >= 0.50 ng/mL              PCT 0.26 - 0.50 ng/mL               AND        <80% decrease in PCT             Encourage initiation of                                             antibiotics       Encourage continuation           of antibiotics    ----------------------------     -----------------------------        PCT >= 0.50 ng/mL                  PCT > 0.50 ng/mL               AND         increase in PCT                  Strongly encourage                                      initiation of antibiotics    Strongly encourage escalation           of antibiotics                                     -----------------------------                                           PCT <= 0.25 ng/mL                                                   OR                                        > 80% decrease in PCT                                     Discontinue / Do not initiate                                             antibiotics Performed at [facility], [address] [place], [place] [address]   CBC     Status: None   Collection Time: 02/28/18  5:34 AM  Result Value Ref Range   WBC 7.3 3.8 - 10.6 K/uL   RBC 4.48 4.40 - 5.90 MIL/uL   Hemoglobin 14.2 13.0 - 18.0 g/dL   HCT 40.4 40.0 - 52.0 %   MCV 90.1 80.0 - 100.0 fL   MCH 31.6 26.0 - 34.0 pg   MCHC 35.1 32.0 - 36.0 g/dL   RDW 12.5  11.5 - 14.5 %   Platelets 249 150 - 440 K/uL    Comment: Performed at [facility], [address], [place], [place] [address]    Current Facility-Administered Medications  Medication Dose Route Frequency Provider Last Rate Last Dose  . 0.9 %  sodium chloride infusion  250 mL Intravenous PRN Lance Coon, MD   Stopped at 02/25/18 [phone]  . amoxicillin-clavulanate (AUGMENTIN) 875-125 MG per tablet 1 tablet  1 tablet Oral Q12H Pyreddy, Reatha Harps, MD   1 tablet at 02/28/18 1050  . enoxaparin (LOVENOX) injection 40 mg  40 mg Subcutaneous Q24H Bettey Costa, MD   40 mg at 02/27/18 2208  . feeding supplement (ENSURE ENLIVE) (ENSURE ENLIVE) liquid 237 mL  237 mL Oral BID BM Pyreddy, Pavan, MD   237 mL at 02/28/18 1357  . guaiFENesin (ROBITUSSIN) 100 MG/5ML solution 100 mg  5 mL Oral Q4H PRN Vaughan Basta, MD   100 mg at 02/27/18 0936  . ipratropium-albuterol (DUONEB) 0.5-2.5 (3) MG/3ML nebulizer solution 3 mL  3 mL Nebulization Q6H PRN Conforti, Bethzy Hauck, DO   3 mL at 02/26/18 1810  . lidocaine (XYLOCAINE) 2 % viscous mouth solution 15 mL  15 mL Mouth/Throat Once Arta Silence, MD      . LORazepam (ATIVAN) injection 2 mg  2 mg Intravenous Q4H PRN Arta Silence, MD      . multivitamin with minerals tablet 1 tablet  1 tablet Oral Daily Conforti, Cara Aguino, DO   1 tablet at 02/28/18 1054  . pantoprazole (PROTONIX) EC tablet 40 mg  40 mg Oral Daily Pyreddy, Reatha Harps, MD   40 mg at 02/28/18 1052  . phenol (CHLORASEPTIC) mouth spray 1 spray  1 spray Mouth/Throat PRN Conforti, Lyndzee Kliebert, DO   1 spray at 02/26/18 1830    Musculoskeletal: Strength & Muscle Tone: within normal limits Gait & Station: normal Patient leans: N/A  Psychiatric Specialty Exam: Physical Exam  Nursing note and vitals reviewed. Constitutional: He appears well-developed and well-nourished.  HENT:  Head: Normocephalic and atraumatic.  Eyes: Pupils are equal, round, and reactive to light. Conjunctivae are normal.  Neck:  Normal range of motion.  Cardiovascular: Regular rhythm and normal heart  sounds.  Respiratory: Effort normal and breath sounds normal.  GI: Soft. He exhibits no distension.  Musculoskeletal: Normal range of motion.  Neurological: He is alert.  Skin: Skin is warm and dry.  Psychiatric: Judgment normal. His affect is blunt. His speech is delayed. He is slowed. Cognition and memory are normal. He expresses suicidal ideation.    Review of Systems  Constitutional: Negative.   HENT: Negative.   Eyes: Negative.   Respiratory: Negative.   Cardiovascular: Negative.   Gastrointestinal: Negative.   Musculoskeletal: Negative.   Skin: Negative.   Neurological: Negative.   Psychiatric/Behavioral: Positive for depression. Negative for hallucinations, memory loss, substance abuse and suicidal ideas. The patient is not nervous/anxious and does not have insomnia.     Blood pressure (!) 146/77, pulse (!) 107, temperature 98.6 F (37 C), temperature source Oral, resp. rate 20, height 6' (1.829 m), weight 106.4 kg, SpO2 91 %.Body mass index is 31.8 kg/m.  General Appearance: Casual  Eye Contact:  Fair  Speech:  Clear and Coherent  Volume:  Decreased  Mood:  Dysphoric  Affect:  Congruent  Thought Process:  Goal Directed  Orientation:  Full (Time, Place, and Person)  Thought Content:  Logical  Suicidal Thoughts:  No  Homicidal Thoughts:  No  Memory:  Immediate;   Fair Recent;   Fair Remote;   Fair  Judgement:  Impaired  Insight:  Shallow  Psychomotor Activity:  Decreased  Concentration:  Concentration: Fair  Recall:  [employer] of Knowledge:  Fair  Language:  Fair  Akathisia:  No  Handed:  Right  AIMS (if indicated):     Assets:  Desire for Improvement Housing  ADL's:  Impaired  Cognition:  Impaired,  Mild  Sleep:        Treatment Plan Summary: Medication management and Plan Patient continues to meet commitment criteria continue under IVC.  He is still requiring some oxygen but  should be improving.  Once he is able to come off of oxygen he will be admitted to the psychiatric ward.  I will sign this out to the psychiatrist on call over the weekend.  Disposition: Recommend psychiatric Inpatient admission when medically cleared.  Alethia Berthold, MD 02/28/2018 7:04 PM

## 2018-03-01 ENCOUNTER — Other Ambulatory Visit: Payer: Self-pay

## 2018-03-01 ENCOUNTER — Inpatient Hospital Stay
Admission: AD | Admit: 2018-03-01 | Discharge: 2018-03-04 | DRG: 885 | Disposition: A | Discharge status: Home / Self Care | Payer: Medicare Other | Attending: Psychiatry | Admitting: Psychiatry

## 2018-03-01 DIAGNOSIS — F209 Schizophrenia, unspecified: Secondary | ICD-10-CM | POA: Diagnosis not present

## 2018-03-01 DIAGNOSIS — J690 Pneumonitis due to inhalation of food and vomit: Secondary | ICD-10-CM | POA: Diagnosis present

## 2018-03-01 DIAGNOSIS — F17210 Nicotine dependence, cigarettes, uncomplicated: Secondary | ICD-10-CM | POA: Diagnosis present

## 2018-03-01 DIAGNOSIS — I10 Essential (primary) hypertension: Secondary | ICD-10-CM | POA: Diagnosis present

## 2018-03-01 DIAGNOSIS — Z630 Problems in relationship with spouse or partner: Secondary | ICD-10-CM | POA: Diagnosis not present

## 2018-03-01 DIAGNOSIS — I4581 Long QT syndrome: Secondary | ICD-10-CM | POA: Diagnosis present

## 2018-03-01 DIAGNOSIS — T43592D Poisoning by other antipsychotics and neuroleptics, intentional self-harm, subsequent encounter: Secondary | ICD-10-CM

## 2018-03-01 DIAGNOSIS — Z79899 Other long term (current) drug therapy: Secondary | ICD-10-CM

## 2018-03-01 DIAGNOSIS — T434X2D Poisoning by butyrophenone and thiothixene neuroleptics, intentional self-harm, subsequent encounter: Secondary | ICD-10-CM | POA: Diagnosis not present

## 2018-03-01 DIAGNOSIS — T50902A Poisoning by unspecified drugs, medicaments and biological substances, intentional self-harm, initial encounter: Secondary | ICD-10-CM | POA: Diagnosis not present

## 2018-03-01 DIAGNOSIS — Z915 Personal history of self-harm: Secondary | ICD-10-CM | POA: Diagnosis not present

## 2018-03-01 DIAGNOSIS — F200 Paranoid schizophrenia: Secondary | ICD-10-CM | POA: Diagnosis not present

## 2018-03-01 DIAGNOSIS — J9601 Acute respiratory failure with hypoxia: Secondary | ICD-10-CM | POA: Diagnosis not present

## 2018-03-01 DIAGNOSIS — J209 Acute bronchitis, unspecified: Secondary | ICD-10-CM | POA: Diagnosis present

## 2018-03-01 DIAGNOSIS — T50901A Poisoning by unspecified drugs, medicaments and biological substances, accidental (unintentional), initial encounter: Secondary | ICD-10-CM | POA: Diagnosis not present

## 2018-03-01 HISTORY — DX: Schizophrenia, unspecified: F20.9

## 2018-03-01 LAB — BASIC METABOLIC PANEL
Anion gap: 7 (ref 5–15)
BUN: 12 mg/dL (ref 6–20)
CO2: 29 mmol/L (ref 22–32)
Calcium: 9 mg/dL (ref 8.9–10.3)
Chloride: 99 mmol/L (ref 98–111)
Creatinine, Ser: 0.73 mg/dL (ref 0.61–1.24)
GFR calc Af Amer: >60 mL/min (ref >60)
GFR calc non Af Amer: >60 mL/min (ref >60)
Glucose, Bld: 107 mg/dL — ABNORMAL HIGH (ref 70–99)
Potassium: 3.8 mmol/L (ref 3.5–5.1)
Sodium: 135 mmol/L (ref 135–145)

## 2018-03-01 MED ORDER — ENSURE ENLIVE PO LIQD: 237.0000 mL | Freq: Two times a day (BID) | ORAL | 12 refills | Status: DC

## 2018-03-01 MED ORDER — ALUM & MAG HYDROXIDE-SIMETH 200-200-20 MG/5ML PO SUSP: 30.0000 mL | ORAL | Status: DC | PRN

## 2018-03-01 MED ORDER — FUROSEMIDE 10 MG/ML IJ SOLN
20.0000 mg | Freq: Once | INTRAMUSCULAR | Status: DC
  Filled 2018-03-01: qty 2

## 2018-03-01 MED ORDER — FUROSEMIDE 20 MG PO TABS
20.0000 mg | ORAL_TABLET | Freq: Once | ORAL | Status: AC
  Administered 2018-03-01: 20 mg via ORAL
  Filled 2018-03-01: qty 1

## 2018-03-01 MED ORDER — MAGNESIUM HYDROXIDE 400 MG/5ML PO SUSP: 30.0000 mL | Freq: Every day | ORAL | Status: DC | PRN

## 2018-03-01 MED ORDER — TRAZODONE HCL 50 MG PO TABS
50.0000 mg | ORAL_TABLET | Freq: Every day | ORAL | Status: DC
  Administered 2018-03-01 – 2018-03-03 (×3): 50 mg via ORAL
  Filled 2018-03-01 (×3): qty 1

## 2018-03-01 MED ORDER — AMOXICILLIN-POT CLAVULANATE 875-125 MG PO TABS: 1.0000 | ORAL_TABLET | Freq: Two times a day (BID) | ORAL | 0 refills | Status: DC

## 2018-03-01 MED ORDER — ACETAMINOPHEN 325 MG PO TABS: 650.0000 mg | ORAL_TABLET | Freq: Four times a day (QID) | ORAL | Status: DC | PRN

## 2018-03-01 NOTE — Plan of Care (Signed)
Pt. Complaint with medications. Pt. Placed on 1:1 for safey for history of suicide attempt. Pt. Does contract verbally for safety. Pt. Denies SI/HI. Pt. Reports he can remain safe while on the unit.    Problem: Medication: Goal: Compliance with prescribed medication regimen will improve Outcome: Progressing   Problem: Self-Concept: Goal: Ability to disclose and discuss suicidal ideas will improve Outcome: Progressing   Problem: Safety: Goal: Ability to remain free from injury will improve Outcome: Progressing

## 2018-03-01 NOTE — Plan of Care (Signed)
  Problem: Education: Goal: Ability to make informed decisions regarding treatment will improve Outcome: Progressing   Problem: Coping: Goal: Coping ability will improve Outcome: Progressing   Problem: Health Behavior/Discharge Planning: Goal: Identification of resources available to assist in meeting health care needs will improve Outcome: Progressing   Problem: Medication: Goal: Compliance with prescribed medication regimen will improve Outcome: Progressing   Problem: Self-Concept: Goal: Ability to disclose and discuss suicidal ideas will improve Outcome: Progressing Goal: Will verbalize positive feelings about self Outcome: Progressing   Problem: Safety: Goal: Ability to remain free from injury will improve Outcome: Progressing

## 2018-03-01 NOTE — Progress Notes (Signed)
Received Scott Velasquez from the medical unit, alert and oriented x4. He was cooperative with the admission process. He endorsed trying to kill himself because he broke up with his girlfriend because she was talking to another boy about sex. He denied feeling suicidal at the present time. He stated hearing voices at intervals.  A sitter is at the bedside. The skin assessment was completed with nurse Lorenda HatchetSlade. He was oriented to his new environment and ate dinner.

## 2018-03-01 NOTE — Tx Team (Signed)
Initial Treatment Plan 03/01/2018 5:44 PM Scott Velasquez [id number]    PATIENT STRESSORS: Loss of girlfriend Marital or family conflict Substance abuse   PATIENT STRENGTHS: Capable of independent living Communication skills Physical Health Supportive family/friends   PATIENT IDENTIFIED PROBLEMS: Poor coping skills  Unemployment  Several suicide attempts                 DISCHARGE CRITERIA:  Ability to meet basic life and health needs Adequate post-discharge living arrangements Improved stabilization in mood, thinking, and/or behavior  PRELIMINARY DISCHARGE PLAN: Return to previous living arrangement  PATIENT/FAMILY INVOLVEMENT: This treatment plan has been presented to and reviewed with the patient, Scott Velasquez.  The patient has been given the opportunity to ask questions and make suggestions.  Rex KrasJoanne  Herby Amick, RN 03/01/2018, 5:44 PM

## 2018-03-01 NOTE — ED Notes (Signed)
[facility] to [facility] by Dr. Kathy Breachakesh.  Attending Physician will be Dr. Johnella MoloneyMcNew.   Patient has been [facility] to room 310, [facility] T' yawn.  Behavioral Medical Unit staff is aware of the admission: Stephanie Pt current nurse has been notified  Call report at [phone]

## 2018-03-01 NOTE — Discharge Summary (Signed)
[facility] [place] [facility]   PATIENT NAME: Scott Velasquez    MR#:  [id number]  DATE OF BIRTH:  03/15/1991  DATE OF ADMISSION:  02/24/2018 ADMITTING PHYSICIAN: Oralia Manis, MD  DATE OF DISCHARGE: 03/01/2018  PRIMARY CARE PHYSICIAN: Patient, No Pcp Per    ADMISSION DIAGNOSIS:  Hypokalemia [E87.6] Suicide attempt (HCC) [T14.91XA] Prolonged Q-T interval on ECG [R94.31] Acute respiratory failure, unspecified whether with hypoxia or hypercapnia (HCC) [J96.00] Haloperidol overdose, intentional self-harm, initial encounter (HCC) [T43.4X2A]  DISCHARGE DIAGNOSIS:  Principal Problem:   Suicide attempt [facility]) Active Problems:   Drug overdose   Schizophrenia (HCC)   Amphetamine abuse (HCC)   Marijuana abuse   SECONDARY DIAGNOSIS:   Past Medical History:  Diagnosis Date  . Suicide attempt [facility])     HOSPITAL COURSE:   27 year old male who was admitted to the hospital for intentional drug overdose with suicidal attempt on Abilify and Haldol requiring intubation for airway protection.  1.  Status post hypoxic respiratory failure due to suicidal attempt and inability to protect airway: She was initially intubated and now has been extubated.  He is not requiring oxygen upon discharge.  2.  Depression/suicidal attempt: Patient was seen by psychiatry.  He had a sitter at bedside.  He will go downstairs to psychiatry unit for further treatment and monitoring.     3.  QTC prolongation due to Haldol: This is improved.    4.  Acute bronchitis: Patient will be on Augmentin for 3 more days.  5.    Hypokalemia: This has been repleted   6. Tobacco dependence: Patient is encouraged to quit smoking. Counseling was provided for 4 minutes   DISCHARGE CONDITIONS AND DIET:   Stable for discharge on regular diet  CONSULTS OBTAINED:  Treatment Team:  Audery Amel, MD  DRUG ALLERGIES:  No Known Allergies  DISCHARGE MEDICATIONS:   Allergies as of 03/01/2018   No  Known Allergies     Medication List    STOP taking these medications   ARIPiprazole 15 MG tablet Commonly known as:  ABILIFY   divalproex 500 MG DR tablet Commonly known as:  DEPAKOTE   doxepin 50 MG capsule Commonly known as:  SINEQUAN   haloperidol 10 MG tablet Commonly known as:  HALDOL   hydrOXYzine 25 MG capsule Commonly known as:  VISTARIL     TAKE these medications   amoxicillin-clavulanate 875-125 MG tablet Commonly known as:  AUGMENTIN Take 1 tablet by mouth every 12 (twelve) hours for 4 days.   feeding supplement (ENSURE ENLIVE) Liqd Take 237 mLs by mouth 2 (two) times daily between meals.         Today   CHIEF COMPLAINT:  No acute events overnight patient denies suicidal ideation   VITAL SIGNS:  Blood pressure (!) 141/96, pulse 96, temperature 98.6 F (37 C), temperature source Oral, resp. rate 16, height 6' (1.829 m), weight 106.4 kg, SpO2 92 %.   REVIEW OF SYSTEMS:  Review of Systems  Constitutional: Negative.  Negative for chills, fever and malaise/fatigue.  HENT: Negative.  Negative for ear discharge, ear pain, hearing loss, nosebleeds and sore throat.   Eyes: Negative.  Negative for blurred vision and pain.  Respiratory: Negative.  Negative for cough, hemoptysis, shortness of breath and wheezing.   Cardiovascular: Negative.  Negative for chest pain, palpitations and leg swelling.  Gastrointestinal: Negative.  Negative for abdominal pain, blood in stool, diarrhea, nausea and vomiting.  Genitourinary: Negative.  Negative for dysuria.  Musculoskeletal: Negative.  Negative for back pain.  Skin: Negative.   Neurological: Negative for dizziness, tremors, speech change, focal weakness, seizures and headaches.  Endo/Heme/Allergies: Negative.  Does not bruise/bleed easily.  Psychiatric/Behavioral: Positive for depression. Negative for hallucinations and suicidal ideas.     PHYSICAL EXAMINATION:  GENERAL:  27 y.o.-year-old patient lying in the  bed with no acute distress.  NECK:  Supple, no jugular venous distention. No thyroid enlargement, no tenderness.  LUNGS: Normal breath sounds bilaterally, no wheezing, rales,rhonchi  No use of accessory muscles of respiration.  CARDIOVASCULAR: S1, S2 normal. No murmurs, rubs, or gallops.  ABDOMEN: Soft, non-tender, non-distended. Bowel sounds present. No organomegaly or mass.  EXTREMITIES: No pedal edema, cyanosis, or clubbing.  PSYCHIATRIC: The patient is alert and oriented x 3.  SKIN: No obvious rash, lesion, or ulcer.   DATA REVIEW:   CBC Recent Labs  Lab 02/28/18 0534  WBC 7.3  HGB 14.2  HCT 40.4  PLT 249    Chemistries  Recent Labs  Lab 02/26/18 0504  02/28/18 0534 03/01/18 0541  NA 141   < > 138 135  K 4.4   < > 3.1* 3.8  CL 109   < > 97* 99  CO2 28   < > 28 29  GLUCOSE 141*   < > 91 107*  BUN 13   < > 12 12  CREATININE 0.78   < > 0.82 0.73  CALCIUM 8.5*   < > 8.5* 9.0  MG 2.2   < > 1.9  --   AST 14*  --   --   --   ALT 12  --   --   --   ALKPHOS 44  --   --   --   BILITOT 0.4  --   --   --    < > = values in this interval not displayed.    Cardiac Enzymes Recent Labs  Lab 02/24/18 2229  TROPONINI <0.03    Microbiology Results  @MICRORSLT48 @  RADIOLOGY:  Dg Chest 2 View  Result Date: 02/27/2018 CLINICAL DATA:  Shortness of breath, productive cough EXAM: CHEST - 2 VIEW COMPARISON:  02/26/2018 FINDINGS: Cardiomegaly. Diffuse interstitial prominence throughout the lungs, most pronounced in the lower lungs. No effusions. No acute bony abnormality. IMPRESSION: Diffuse interstitial prominence, most confluent in the lower lobes with mild cardiomegaly. This could represent edema or atypical infection. Electronically Signed   By: Charlett NoseKevin  Dover M.D.   On: 02/27/2018 10:54      Allergies as of 03/01/2018   No Known Allergies     Medication List    STOP taking these medications   ARIPiprazole 15 MG tablet Commonly known as:  ABILIFY   divalproex 500 MG  DR tablet Commonly known as:  DEPAKOTE   doxepin 50 MG capsule Commonly known as:  SINEQUAN   haloperidol 10 MG tablet Commonly known as:  HALDOL   hydrOXYzine 25 MG capsule Commonly known as:  VISTARIL     TAKE these medications   amoxicillin-clavulanate 875-125 MG tablet Commonly known as:  AUGMENTIN Take 1 tablet by mouth every 12 (twelve) hours for 4 days.   feeding supplement (ENSURE ENLIVE) Liqd Take 237 mLs by mouth 2 (two) times daily between meals.           Management plans discussed with the patient and he is in agreement. Stable for discharge   Patient should follow up with psych  CODE STATUS:     Code Status  Orders  (From admission, onward)         Start     Ordered   02/25/18 0304  Full code  Continuous     02/25/18 0303        Code Status History    This patient has a current code status but no historical code status.      TOTAL TIME TAKING CARE OF THIS PATIENT: 38 minutes.    Note: This dictation was prepared with Dragon dictation along with smaller phrase technology. Any transcriptional errors that result from this process are unintentional.  Jayln Madeira M.D on 03/01/2018 at 9:31 AM  Between 7am to 6pm - Pager - [phone] After 6pm go to [email or web address] - password [email or web address]  [facility] [place] [facility]  Office  [phone]  CC: Primary care physician; Patient, No Pcp Per

## 2018-03-02 DIAGNOSIS — T1491XA Suicide attempt, initial encounter: Secondary | ICD-10-CM

## 2018-03-02 DIAGNOSIS — I10 Essential (primary) hypertension: Secondary | ICD-10-CM

## 2018-03-02 DIAGNOSIS — T50902A Poisoning by unspecified drugs, medicaments and biological substances, intentional self-harm, initial encounter: Secondary | ICD-10-CM

## 2018-03-02 DIAGNOSIS — Z630 Problems in relationship with spouse or partner: Secondary | ICD-10-CM

## 2018-03-02 DIAGNOSIS — F200 Paranoid schizophrenia: Secondary | ICD-10-CM

## 2018-03-02 DIAGNOSIS — J690 Pneumonitis due to inhalation of food and vomit: Secondary | ICD-10-CM

## 2018-03-02 MED ORDER — AMOXICILLIN-POT CLAVULANATE 875-125 MG PO TABS
1.0000 | ORAL_TABLET | Freq: Two times a day (BID) | ORAL | Status: DC
  Administered 2018-03-02 – 2018-03-04 (×5): 1 via ORAL
  Filled 2018-03-02 (×7): qty 1

## 2018-03-02 MED ORDER — HYDROXYZINE HCL 25 MG PO TABS: 25.0000 mg | ORAL_TABLET | Freq: Four times a day (QID) | ORAL | Status: DC | PRN

## 2018-03-02 MED ORDER — ARIPIPRAZOLE 10 MG PO TABS
10.0000 mg | ORAL_TABLET | Freq: Every day | ORAL | Status: DC
  Administered 2018-03-02 – 2018-03-03 (×2): 10 mg via ORAL
  Filled 2018-03-02 (×2): qty 1

## 2018-03-02 MED ORDER — DOXEPIN HCL 50 MG PO CAPS
50.0000 mg | ORAL_CAPSULE | Freq: Every day | ORAL | Status: DC
  Administered 2018-03-02: 50 mg via ORAL
  Filled 2018-03-02 (×2): qty 1

## 2018-03-02 MED ORDER — HALOPERIDOL 5 MG PO TABS
5.0000 mg | ORAL_TABLET | Freq: Two times a day (BID) | ORAL | Status: DC
  Administered 2018-03-02 – 2018-03-03 (×3): 5 mg via ORAL
  Filled 2018-03-02 (×3): qty 1

## 2018-03-02 MED ORDER — DIVALPROEX SODIUM 500 MG PO DR TAB
500.0000 mg | DELAYED_RELEASE_TABLET | Freq: Two times a day (BID) | ORAL | Status: DC
  Administered 2018-03-02 – 2018-03-04 (×5): 500 mg via ORAL
  Filled 2018-03-02 (×5): qty 1

## 2018-03-02 NOTE — Progress Notes (Signed)
1:1 safety notes  0800- Patient present in the milieu ambulating the hall with safety sitter at his side. No distress noted.  0900- Patient on the phone with safety sitter within arms reach. No distress noted.  1000- MD in to assess patient. Safety sitter order discontinued.

## 2018-03-02 NOTE — Progress Notes (Signed)
1:1 Observation Note  1900 Pt. Observed in bed awake with 1:1 present for safety. Pt. In no distress.  2000 Pt. Observed sleeping in room unlabored respirations with 1:1 present for safety. 2100 Pt. Observed in day room interacting with staff and peers with 1:1 present for safety. No distress noted.  2200 Pt. Observed sleeping in room unlabored respirations with 1:1 present for safety. 2300 Pt. Observed sleeping in room unlabored respirations with 1:1 present for safety. 0000 Pt. Observed sleeping in room unlabored respirations with 1:1 present for safety. 0100 Pt. Observed sleeping in room unlabored respirations with 1:1 present for safety. 0200 Pt. Observed sleeping in room unlabored respirations with 1:1 present for safety. 0300 Pt. Observed sleeping in room unlabored respirations with 1:1 present for safety. 0400 Pt. Observed sleeping in room unlabored respirations with 1:1 present for safety. 0500 Pt. Observed sleeping in room unlabored respirations with 1:1 present for safety. 0600 Pt. Observed sleeping in room unlabored respirations with 1:1 present for safety. 0700 Pt. Observed sleeping in room unlabored respirations with 1:1 present for safety.   Per MD orders. Safety Maintained.

## 2018-03-02 NOTE — [facility] Group Notes (Signed)
[facility] Group Notes:  (Nursing/MHT/Case Management/Adjunct)  Date:  03/02/2018  Time:  1:04 AM  Type of Therapy:  Group Therapy  Participation Level:  Active  Participation Quality:  Appropriate  Affect:  Appropriate  Cognitive:  Alert  Insight:  Good  Engagement in Group:  Engaged  Modes of Intervention:  Support  Summary of Progress/Problems:  Scott Velasquez 03/02/2018, 1:04 AM

## 2018-03-02 NOTE — H&P (Signed)
Psychiatric Admission Assessment Adult  Patient Identification: Scott Velasquez MRN:  [id number] Date of Evaluation:  03/02/2018 Chief Complaint:  Schizophrenia Principal Diagnosis: <principal problem not specified> Diagnosis:   Patient Active Problem List   Diagnosis Date Noted  . Schizophrenia ([place]) [F20.9] 02/26/2018  . Amphetamine abuse ([place]) [F15.10] 02/26/2018  . Marijuana abuse [F12.10] 02/26/2018  . Suicide attempt ([place]) [T14.91XA] 02/25/2018  . Drug overdose [T50.901A] 02/25/2018   History of Present Illness: Scott Velasquez is a 27 year old Caucasian male with a past history of being diagnosed with schizophrenia, presented to the hospital after intentional overdose.  Was admitted on the medical floor after he developed respiratory failure and needed to be intubated.  Patient extubated 2 days ago.  Was transferred to the psychiatry floor yesterday after being deemed medically stable.  During interview today patient endorses that he overdosed on 50 pills (comprising Abilify and Haldol), endorses this was an impulsive attempt.  Says he felt tearful and sad after he broke up with his girlfriend.  His girlfriend had been cheating on him for the last 2-1/2 years he was with that.  He has had when he talks about this.  Says he has been on Depakote, doxepin, Abilify and Haldol for the last few years and they keep the voices that he hears under control.  When not on medications he hears his cousins voices, does not elucidate what they tell him to do.  Says the voices are really distressing and he would like to get back on his medications to control the voices.  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  Endorses that he currently does not have any suicidal thoughts or thoughts of dying.  Looks forward to the future and would like to get his life back together. Associated Signs/Symptoms: Depression Symptoms:  Denies (Hypo) Manic Symptoms:  Hallucinations, Anxiety Symptoms:   Denies Psychotic Symptoms:  Hallucinations: Auditory PTSD Symptoms: Negative Total Time spent with patient: 1 hour  Past Psychiatric History: Patient endorses that he is had a total of 20 hospitalizations, tells me he does not remember where he was admitted previously.  Sees an outpatient [facility].  Has been on Depakote, doxepin, Abilify and Haldol for many years.  Endorses that these medications have been controlled voices.  This is a previous history of 2 suicidal attempts both by intentional overdosing.  Is the patient at risk to self? No.  Has the patient been a risk to self in the past 6 months? Yes.    Has the patient been a risk to self within the distant past? No.  Is the patient a risk to others? No.  Has the patient been a risk to others in the past 6 months? No.  Has the patient been a risk to others within the distant past? No.   Prior Inpatient Therapy:   Prior Outpatient Therapy:    Alcohol Screening: 1. How often do you have a drink containing alcohol?: Monthly or less 2. How many drinks containing alcohol do you have on a typical day when you are drinking?: 1 or 2 3. How often do you have six or more drinks on one occasion?: Never AUDIT-C Score: 1 Intervention/Follow-up: Alcohol Education Substance Abuse History in the last 12 months:  Yes.   Consequences of Substance Abuse: Negative Previous Psychotropic Medications: Yes  Psychological Evaluations: Yes  Past Medical History:  Past Medical History:  Diagnosis Date  . Schizophrenia ([place])   . Suicide attempt ([place])  Past Surgical History:  Procedure Laterality Date  . NO PAST SURGERIES     Family History: History reviewed. No pertinent family history. Family Psychiatric  History: History of suicide in maternal grandfather, by intentional overdose Tobacco Screening: Have you used any form of tobacco in the last 30 days? (Cigarettes, Smokeless Tobacco, Cigars, and/or Pipes): Yes Tobacco use,  Select all that apply: 5 or more cigarettes per day Are you interested in Tobacco Cessation Medications?: No, patient refused Counseled patient on smoking cessation including recognizing danger situations, developing coping skills and basic information about quitting provided: Yes Social History:  Social History   Substance and Sexual Activity  Alcohol Use Not Currently     Social History   Substance and Sexual Activity  Drug Use Yes  . Types: Methamphetamines    Additional Social History:  Patient lives with his grandparents.  Grew up in [place].  Denies any trauma in childhood.  Finished GED.  Has worked a few other jobs.  There is a possibility of borderline intelligence versus lack of sophistication.  Gets SSI.  Smokes 1 pack of cigarettes every day.  Denies any other substance use.                         Allergies:  No Known Allergies Lab Results:  Results for orders placed or performed during the hospital encounter of 02/24/18 (from the past 48 hour(s))  Basic metabolic panel     Status: Abnormal   Collection Time: 03/01/18  5:41 AM  Result Value Ref Range   Sodium 135 135 - 145 mmol/L   Potassium 3.8 3.5 - 5.1 mmol/L   Chloride 99 98 - 111 mmol/L   CO2 29 22 - 32 mmol/L   Glucose, Bld 107 (H) 70 - 99 mg/dL   BUN 12 6 - 20 mg/dL   Creatinine, Ser 0.73 0.61 - 1.24 mg/dL   Calcium 9.0 8.9 - 10.3 mg/dL   GFR calc non Af Amer >60 >60 mL/min   GFR calc Af Amer >60 >60 mL/min    Comment: (NOTE) The eGFR has been calculated using the CKD EPI equation. This calculation has not been validated in all clinical situations. eGFR's persistently <60 mL/min signify possible Chronic Kidney Disease.    Anion gap 7 5 - 15    Comment: Performed at [facility], [address] [place], [place] [address]    Blood Alcohol level:  Lab Results  Component Value Date   [facility] <10 29/24/4628    Metabolic Disorder Labs:  No results found for: HGBA1C,  MPG No results found for: PROLACTIN Lab Results  Component Value Date   TRIG 93 02/25/2018    Current Medications: Current Facility-Administered Medications  Medication Dose Route Frequency Provider Last Rate Last Dose  . acetaminophen (TYLENOL) tablet 650 mg  650 mg Oral Q6H PRN Ramond Dial, MD      . alum & mag hydroxide-simeth (MAALOX/MYLANTA) 200-200-20 MG/5ML suspension 30 mL  30 mL Oral Q4H PRN Ramond Dial, MD      . ARIPiprazole (ABILIFY) tablet 10 mg  10 mg Oral Daily Ramond Dial, MD   10 mg at 03/02/18 1003  . divalproex (DEPAKOTE) DR tablet 500 mg  500 mg Oral Q12H Ramond Dial, MD   500 mg at 03/02/18 1002  . doxepin (SINEQUAN) capsule 50 mg  50 mg Oral QHS Ramond Dial, MD      . haloperidol (HALDOL) tablet 5 mg  5 mg Oral BID Ramond Dial, MD   5 mg at 03/02/18 1003  . hydrOXYzine (ATARAX/VISTARIL) tablet 25 mg  25 mg Oral Q6H PRN Ramond Dial, MD      . magnesium hydroxide (MILK OF MAGNESIA) suspension 30 mL  30 mL Oral Daily PRN Ramond Dial, MD      . traZODone (DESYREL) tablet 50 mg  50 mg Oral QHS Ramond Dial, MD   50 mg at 03/01/18 2100   PTA Medications: Medications Prior to Admission  Medication Sig Dispense Refill Last Dose  . haloperidol (HALDOL) 5 MG tablet Take 5 mg by mouth 2 (two) times daily.     Marland Kitchen amoxicillin-clavulanate (AUGMENTIN) 875-125 MG tablet Take 1 tablet by mouth every 12 (twelve) hours for 4 days. 8 tablet 0   . feeding supplement, ENSURE ENLIVE, (ENSURE ENLIVE) LIQD Take 237 mLs by mouth 2 (two) times daily between meals. 237 mL 12     Musculoskeletal: Strength & Muscle Tone: within normal limits Gait & Station: normal Patient leans: N/A  Psychiatric Specialty Exam: Physical Exam  ROS  Blood pressure 137/82, pulse 98, temperature 98.9 F (37.2 C), temperature source Oral, resp. rate 17, height 6' (1.829 m), weight 103.9 kg, SpO2 97 %.Body mass index is 31.06 kg/m.  General  Appearance: Disheveled  Eye Contact:  Good  Speech:  Normal Rate  Volume:  Normal  Mood:  Anxious  Affect:  Congruent  Thought Process:  Coherent  Orientation:  Full (Time, Place, and Person)  Thought Content:  Logical  Suicidal Thoughts:  No  Homicidal Thoughts:  No  Memory:  Negative  Judgement:  Impaired, doing better  Insight:  Good  Psychomotor Activity:  Normal  AIMS (if indicated):     Assets:  Communication Skills Desire for Improvement  ADL's:  Intact  Cognition:  WNL  Sleep:  Number of Hours: 7.3  Assessment 27 year old male with a past history of hypertension, joint pain, schizophrenia, 2 episodes of intentional overdose, presenting to the hospital after an intentional overdose.  Patient needed to be intubated and was transferred to the psychiatry floor after being deemed medically stable.  Currently patient denies any suicidal thoughts.  Is apprehensive auditory hallucinations coming back because of being off his medications.  Request that he be restarted on Depakote, Abilify, Haldol and doxepin.  There is a possibility of borderline intelligence versus lack of sophistication also.  This could also be an effect of cognitive deficits from chronic psychosis.  Treatment plan #Schizophrenia -Restart Depakote 500 mg twice daily -Restart Abilify at 10 mg daily, patient takes 15 mg usually -Restart Haldol 5 mg twice daily, need to clarify if this is 5 mg versus 10 mg twice daily -Restart doxepin 50 mg at bedtime  #HTN -Patient does not remember what medication he was on for hypertension, this can be clarified by pharmacy and thereafter restarted on medication.  #Aspiration pneumonitis -Tinea Augmentin 875-125 mg every 12 hours for 4 days.  Current QTC is 491 ms, A1c, TSH and lipid panel pending.  Treatment Plan Summary: Daily contact with patient to assess and evaluate symptoms and progress in treatment and Medication management  Observation Level/Precautions:   Continuous Observation  Laboratory:  TSH, HbA1c and lipid panel  Psychotherapy:    Medications:    Consultations:    Discharge Concerns:    Estimated LOS:  Other:     Physician Treatment Plan for Primary Diagnosis: <principal problem not specified> Long Term Goal(s): Improvement in symptoms so as ready for  discharge  Short Term Goals: Ability to identify changes in lifestyle to reduce recurrence of condition will improve and Ability to verbalize feelings will improve  Physician Treatment Plan for Secondary Diagnosis: Active Problems:   Schizophrenia ([place])  Long Term Goal(s): Improvement in symptoms so as ready for discharge  Short Term Goals: Ability to identify changes in lifestyle to reduce recurrence of condition will improve and Ability to verbalize feelings will improve  I certify that inpatient services furnished can reasonably be expected to improve the patient's condition.    Ramond Dial, MD 9/15/201912:11 PM

## 2018-03-02 NOTE — [facility] Group Notes (Signed)
LCSW Group Therapy Note 03/02/2018 1:15pm  Type of Therapy and Topic: Group Therapy: Feelings Around Returning Home & Establishing a Supportive Framework and Supporting Oneself When Supports Not Available  Participation Level: Active  Description of Group:  Patients first processed thoughts and feelings about upcoming discharge. These included fears of upcoming changes, lack of change, new living environments, judgements and expectations from others and overall stigma of mental health issues. The group then discussed the definition of a supportive framework, what that looks and feels like, and how do to discern it from an unhealthy non-supportive network. The group identified different types of supports as well as what to do when your family/friends are less than helpful or unavailable  Therapeutic Goals  1. Patient will identify one healthy supportive network that they can use at discharge. 2. Patient will identify one factor of a supportive framework and how to tell it from an unhealthy network. 3. Patient able to identify one coping skill to use when they do not have positive supports from others. 4. Patient will demonstrate ability to communicate their needs through discussion and/or role plays.  Summary of Patient Progress:  Patient reported that he feels "good." Pt engaged during group session. As patients processed their anxiety about discharge and described healthy supports patient shared that he is ready to be discharge. He reports he had a lot of time to think about what happened and feels much better. Pt. Listed his grandparents, parents and uncle as part of his support system.  Patients identified at least one self-care tool they were willing to use after discharge.   Therapeutic Modalities Cognitive Behavioral Therapy Motivational Interviewing   Jocelyn Lowery  CUEBAS-COLON, LCSW 03/02/2018 10:09 AM

## 2018-03-02 NOTE — Progress Notes (Signed)
Patient has been out of room and visible in the milieu. Alert and oriented. Denying thoughts of self harm. Denying hallucinations. Compliant with medications and reporting that "I have been feeling better since I have been here". Patient attended group, had a snack and received bedtime medications.. No sign of distress. Staff continue to provide support and encouragements.

## 2018-03-02 NOTE — Plan of Care (Signed)
Contracts for safety. Denies SI. Compliant with medications.

## 2018-03-02 NOTE — Progress Notes (Signed)
D: Pt denies SI/HI/AVH, verbally is able to contract for safety. Pt. Reports he can remain safe while on the unit. Pt. Placed on 1:1 for safety due to recent suicide attempt for safety. Pt. Is recent admission from medical floor and engagement during assessments is poor. For safety 1:1 per nursing and MD judgement will remain in place until MD reassesses the patient. Pt. Forwards little during assessments and is minimal. Pt. Reports, "I just really am exhausted and want to sleep". Pt. Participation with groups and snacks poor. Pt. Isolative and withdrawn to room. Pt. Reports he's doing alright. Pt. presentation is flat and blunted frequently. No observation of responding to internal stimuli.   A: Q x 15 minute observation checks were completed for safety. Patient was provided with education, but refuses.  Patient was given/offered medications per orders. Patient  was encourage to attend groups, participate in unit activities and continue with plan of care. Pt. Chart and plans of care reviewed. Pt. Given support and encouragement.   R: Patient is complaint with medication and unit procedures for the most part this evening. Participation is poor though.            Precautionary checks every 15 minutes for safety maintained, room free of safety hazards, patient sustains no injury or falls during this shift. Will endorse care to next shift.

## 2018-03-02 NOTE — BHH Counselor (Signed)
Adult Comprehensive Assessment  Patient ID: Scott LarsenBritt Marcucci, male   DOB: 06/23/1990, 27 y.o.   MRN: [id number]  Information Source: Information source: Patient  Current Stressors:  Patient states their primary concerns and needs for treatment are:: "overdose" Patient states their goals for this hospitilization and ongoing recovery are:: "feel better" Educational / Learning stressors: none reported Employment / Job issues: "unable to keep a job, that's when I start going crazy again" Family Relationships: "goodEngineer, petroleum" Financial / Lack of resources (include bankruptcy): disability Housing / Lack of housing: resides with grandparents  Physical health (include injuries & life threatening diseases): none reported Social relationships: "not many" Substance abuse: pt denies use Bereavement / Loss: none reported  Living/Environment/Situation:  Living Arrangements: Other relatives Who else lives in the home?: grandparents  How long has patient lived in current situation?: 2 months What is atmosphere in current home: Comfortable, ParamedicLoving, Supportive  Family History:  Marital status: Single Are you sexually active?: No What is your sexual orientation?: straight Has your sexual activity been affected by drugs, alcohol, medication, or emotional stress?: no Does patient have children?: Yes How many children?: 1 How is patient's relationship with their children?: pt reports he has a 1.5 year daughter - he states she lives with his mom and they have a good relationship  Childhood History:  By whom was/is the patient raised?: Mother Description of patient's relationship with caregiver when they were a child: "good" Patient's description of current relationship with people who raised him/her: "on the rocks but still good" How were you disciplined when you got in trouble as a child/adolescent?: "I really wasn't, was disciplined by going to mental health instutions" Does patient have siblings?: Yes Number of  Siblings: 2 Description of patient's current relationship with siblings: pt reports he has 2 older brothers and they are both very supportive Did patient suffer any verbal/emotional/physical/sexual abuse as a child?: No Did patient suffer from severe childhood neglect?: No Has patient ever been sexually abused/assaulted/raped as an adolescent or adult?: No Was the patient ever a victim of a crime or a disaster?: No Witnessed domestic violence?: No Has patient been effected by domestic violence as an adult?: No  Education:  Highest grade of school patient has completed: GED Currently a Consulting civil engineerstudent?: No Learning disability?: No  Employment/Work Situation:   Employment situation: On disability Why is patient on disability: mental health How long has patient been on disability: 6 years  Patient's job has been impacted by current illness: No What is the longest time patient has a held a job?: 1.5 years Where was the patient employed at that time?: [employer] Did You Receive Any Psychiatric Treatment/Services While in the [employer]?: No Are There Guns or Other Weapons in Your Home?: No  Financial Resources:   Surveyor, quantityinancial resources: [employer]icare, Medicaid, Insurance claims handlereceives SSDI Does patient have a Lawyerrepresentative payee or guardian?: No  Alcohol/Substance Abuse:   What has been your use of drugs/alcohol within the last 12 months?: pt denies use If attempted suicide, did drugs/alcohol play a role in this?: No Alcohol/Substance Abuse Treatment Hx: Denies past history Has alcohol/substance abuse ever caused legal problems?: No  Social Support System:   Conservation officer, natureatient's Community Support System: Good Describe Community Support System: family Type of faith/religion: "I belief there is more than one god" How does patient's faith help to cope with current illness?: "I can't get along with most people because I don't chose one god over the other"  Leisure/Recreation:   Leisure and Hobbies: color, read,  watch tv  Strengths/Needs:   What is the patient's perception of their strengths?: working, good at having fun, determined Patient states they can use these personal strengths during their treatment to contribute to their recovery: "I don't really need help with treatment" Patient states these barriers may affect/interfere with their treatment: none reported Patient states these barriers may affect their return to the community: none reported  Discharge Plan:   Currently receiving community mental health services: Yes (From Whom)([facility] in [place]) Patient states concerns and preferences for aftercare planning are: "I dont have any concerns" Patient states they will know when they are safe and ready for discharge when: "I don't have any symptoms of depression ,anger, or anxiety" Does patient have access to transportation?: Yes(pt reports his grandparents will pick him up) Does patient have financial barriers related to discharge medications?: No Will patient be returning to same living situation after discharge?: Yes(pt will return to his grandparent's house)  Summary/Recommendations:   Summary and Recommendations (to be completed by the evaluator): Patient is a 27 year old male admitted involuntarily and diagnosed with Schizophrenia.  Patient has a past history of being diagnosed with schizophrenia, presented to the hospital after intentional overdose.  The patient was admitted on the medical floor after he developed respiratory failure and needed to be intubated.  The patient was transferred to the behavioral medical unit after being deemed medically stable. Patient endorses that he overdosed on 50 pills (comprising Abilify and Haldol). He reports that this was an impulsive attempt. Patient reports he has been on Depakote, doxepin, Abilify and Haldol for the last few years and they keep the voices that he hears under control.  When not on medications he hears voices.  Patient says the voices are  really distressing and he would like to get back on his medications to control the voices.  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 Patient will benefit from crisis stabilization, medication evaluation, group therapy and psychoeducation. In addition to case management for discharge planning. At discharge it is recommended that patient adhere to the established discharge plan and continue treatment.   Jaydi Bray  CUEBAS-COLON. 03/02/2018

## 2018-03-02 NOTE — [facility] Suicide Risk Assessment (Signed)
[facility]   Nursing information obtained from:  Patient Demographic factors:  Male, Caucasian, Unemployed, Low socioeconomic status Current Mental Status:  Suicidal ideation indicated by patient Loss Factors:  Loss of significant relationship Historical Factors:  Prior suicide attempts, Family history of mental illness or substance abuse Risk Reduction Factors:  Living with another person, especially a relative  Total Time spent with patient: 15 minutes Principal Problem: <principal problem not specified> Diagnosis:   Patient Active Problem List   Diagnosis Date Noted  . Schizophrenia (HCC) [F20.9] 02/26/2018  . 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 Marijuana abuse [F12.10] 02/26/2018  . Suicide attempt (HCC) [T14.91XA] 02/25/2018  . Drug overdose [T50.901A] 02/25/2018   Subjective Data: Patient is currently admitted after intentional overdose.  Patient has a previous history of 2 intentional overdoses.  Currently patient contract for safety.  Continued Clinical Symptoms:    The "Alcohol Use Disorders Identification Test", Guidelines for Use in Primary Care, Second Edition.  World Science writerHealth Organization [facility](WHO). Score between 0-7:  no or low risk or alcohol related problems. Score between 8-15:  moderate risk of alcohol related problems. Score between 16-19:  high risk of alcohol related problems. Score 20 or above:  warrants further diagnostic evaluation for alcohol dependence and treatment.   CLINICAL FACTORS:   Schizophrenia:   Command hallucinatons   Musculoskeletal: Strength & Muscle Tone: within normal limits Gait & Station: normal Patient leans: N/A  Psychiatric Specialty Exam: Physical Exam  ROS  Blood pressure 137/82, pulse 98, temperature 98.9 F (37.2 C), temperature source Oral, resp. rate 17, height 6' (1.829 m), weight 103.9 kg, SpO2 97 %.Body mass index is 31.06 kg/m.  General Appearance: Disheveled  Eye Contact:  Good   Speech:  Normal Rate  Volume:  Normal  Mood:  Anxious  Affect:  Congruent  Thought Process:  Coherent  Orientation:  Full (Time, Place, and Person)  Thought Content:  Logical  Suicidal Thoughts:  No  Homicidal Thoughts:  No  Memory:  Negative  Judgement:  Impaired  Insight:  Good  Psychomotor Activity:  Normal  Assets:  Communication Skills  ADL's:  Intact  Cognition:  WNL  Sleep:  Number of Hours: 7.3      COGNITIVE FEATURES THAT CONTRIBUTE TO RISK:  Loss of executive function    SUICIDE RISK:   Moderate:  Frequent suicidal ideation with limited intensity, and duration, some specificity in terms of plans, no associated intent, good self-control, limited dysphoria/symptomatology, some risk factors present, and identifiable protective factors, including available and accessible social support.  PLAN OF CARE: Patient currently contracts for safety.  Will restart patient on his medications and monitor him.  Has a lot of social support, lives with his grandparents.  I certify that inpatient services furnished can reasonably be expected to improve the patient's condition.   Aundria RudGopalkumar Dylyn Mclaren, MD 03/02/2018, 12:26 PM

## 2018-03-02 NOTE — Plan of Care (Signed)
Patient present in the milieu upon this writer's arrival to the unit. Denies having thoughts of self harm, states, "No, I haven't felt that way since I woke up when I first came to the hospital. I feel much better now." MD in to assess patient for safety. Patient taken off of the 1:1 safety sitter this morning. Patient ambulating the unit with a steady gait. Compliant with medications and meals. Milieu remains safe with q 15 minute safety checks.

## 2018-03-03 DIAGNOSIS — F209 Schizophrenia, unspecified: Principal | ICD-10-CM

## 2018-03-03 LAB — LIPID PANEL
Cholesterol: 183 mg/dL (ref 0–200)
HDL: 29 mg/dL — ABNORMAL LOW (ref >40)
LDL Cholesterol: 128 mg/dL — ABNORMAL HIGH (ref 0–99)
Total CHOL/HDL Ratio: 6.3 RATIO
Triglycerides: 130 mg/dL (ref <150)
VLDL: 26 mg/dL (ref 0–40)

## 2018-03-03 LAB — TSH: TSH: 1.273 u[IU]/mL (ref 0.350–4.500)

## 2018-03-03 MED ORDER — ARIPIPRAZOLE 5 MG PO TABS
15.0000 mg | ORAL_TABLET | Freq: Every day | ORAL | Status: DC
  Administered 2018-03-04: 15 mg via ORAL
  Filled 2018-03-03: qty 1

## 2018-03-03 MED ORDER — HALOPERIDOL 5 MG PO TABS
5.0000 mg | ORAL_TABLET | Freq: Two times a day (BID) | ORAL | Status: DC
  Administered 2018-03-03 – 2018-03-04 (×3): 5 mg via ORAL
  Filled 2018-03-03 (×3): qty 1

## 2018-03-03 MED ORDER — TRAZODONE HCL 100 MG PO TABS
100.0000 mg | ORAL_TABLET | Freq: Every evening | ORAL | Status: DC | PRN
  Administered 2018-03-03: 100 mg via ORAL
  Filled 2018-03-03: qty 1

## 2018-03-03 MED ORDER — HALOPERIDOL 5 MG PO TABS: 10.0000 mg | ORAL_TABLET | Freq: Two times a day (BID) | ORAL | Status: DC

## 2018-03-03 NOTE — Tx Team (Signed)
Interdisciplinary Treatment and Diagnostic Plan Update  03/03/2018 Time of Session: 11am Scott LarsenBritt Velasquez MRN: [id number]  Principal Diagnosis: <principal problem not specified>  Secondary Diagnoses: Active Problems:   Schizophrenia (HCC)   Current Medications:  Current Facility-Administered Medications  Medication Dose Route Frequency Provider Last Rate Last Dose  . acetaminophen (TYLENOL) tablet 650 mg  650 mg Oral Q6H PRN Aundria Rudakesh, Gopalkumar, MD      . alum & mag hydroxide-simeth (MAALOX/MYLANTA) 200-200-20 MG/5ML suspension 30 mL  30 mL Oral Q4H PRN Aundria Rudakesh, Gopalkumar, MD      . amoxicillin-clavulanate (AUGMENTIN) 875-125 MG per tablet 1 tablet  1 tablet Oral Q12H Aundria Rudakesh, Gopalkumar, MD   1 tablet at 03/03/18 0817  . [START ON 03/04/2018] ARIPiprazole (ABILIFY) tablet 15 mg  15 mg Oral Daily McNew, Holly R, MD      . divalproex (DEPAKOTE) DR tablet 500 mg  500 mg Oral Q12H Aundria Rudakesh, Gopalkumar, MD   500 mg at 03/03/18 0817  . haloperidol (HALDOL) tablet 10 mg  10 mg Oral BID McNew, Ileene HutchinsonHolly R, MD      . hydrOXYzine (ATARAX/VISTARIL) tablet 25 mg  25 mg Oral Q6H PRN Aundria Rudakesh, Gopalkumar, MD      . magnesium hydroxide (MILK OF MAGNESIA) suspension 30 mL  30 mL Oral Daily PRN Aundria Rudakesh, Gopalkumar, MD      . traZODone (DESYREL) tablet 50 mg  50 mg Oral QHS Aundria Rudakesh, Gopalkumar, MD   50 mg at 03/02/18 2109   PTA Medications: Medications Prior to Admission  Medication Sig Dispense Refill Last Dose  . haloperidol (HALDOL) 5 MG tablet Take 5 mg by mouth 2 (two) times daily.     Marland Kitchen. amoxicillin-clavulanate (AUGMENTIN) 875-125 MG tablet Take 1 tablet by mouth every 12 (twelve) hours for 4 days. 8 tablet 0   . feeding supplement, ENSURE ENLIVE, (ENSURE ENLIVE) LIQD Take 237 mLs by mouth 2 (two) times daily between meals. 237 mL 12     Patient Stressors: Loss of girlfriend Marital or family conflict Substance abuse  Patient Strengths: Capable of independent living Communication skills Physical  Health Supportive family/friends  Treatment Modalities: Medication Management, Group therapy, Case management,  1 to 1 session with clinician, Psychoeducation, Recreational therapy.   Physician Treatment Plan for Primary Diagnosis: <principal problem not specified> Long Term Goal(s): Improvement in symptoms so as ready for discharge Improvement in symptoms so as ready for discharge   Short Term Goals: Ability to identify changes in lifestyle to reduce recurrence of condition will improve Ability to verbalize feelings will improve Ability to identify changes in lifestyle to reduce recurrence of condition will improve Ability to verbalize feelings will improve  Medication Management: Evaluate patient's response, side effects, and tolerance of medication regimen.  Therapeutic Interventions: 1 to 1 sessions, Unit Group sessions and Medication administration.  Evaluation of Outcomes: Progressing  Physician Treatment Plan for Secondary Diagnosis: Active Problems:   Schizophrenia (HCC)  Long Term Goal(s): Improvement in symptoms so as ready for discharge Improvement in symptoms so as ready for discharge   Short Term Goals: Ability to identify changes in lifestyle to reduce recurrence of condition will improve Ability to verbalize feelings will improve Ability to identify changes in lifestyle to reduce recurrence of condition will improve Ability to verbalize feelings will improve     Medication Management: Evaluate patient's response, side effects, and tolerance of medication regimen.  Therapeutic Interventions: 1 to 1 sessions, Unit Group sessions and Medication administration.  Evaluation of Outcomes: Progressing   RN Treatment Plan for  Primary Diagnosis: <principal problem not specified> Long Term Goal(s): Knowledge of disease and therapeutic regimen to maintain health will improve  Short Term Goals: Ability to verbalize feelings will improve, Ability to identify and develop  effective coping behaviors will improve and Compliance with prescribed medications will improve  Medication Management: RN will administer medications as ordered by provider, will assess and evaluate patient's response and provide education to patient for prescribed medication. RN will report any adverse and/or side effects to prescribing provider.  Therapeutic Interventions: 1 on 1 counseling sessions, Psychoeducation, Medication administration, Evaluate responses to treatment, Monitor vital signs and CBGs as ordered, Perform/monitor CIWA, COWS, AIMS and Fall Risk screenings as ordered, Perform wound care treatments as ordered.  Evaluation of Outcomes: Progressing   LCSW Treatment Plan for Primary Diagnosis: <principal problem not specified> Long Term Goal(s): Safe transition to appropriate next level of care at discharge, Engage patient in therapeutic group addressing interpersonal concerns.  Short Term Goals: Engage patient in aftercare planning with referrals and resources, Increase social support, Facilitate patient progression through stages of change regarding substance use diagnoses and concerns, Identify triggers associated with mental health/substance abuse issues and Increase skills for wellness and recovery  Therapeutic Interventions: Assess for all discharge needs, 1 to 1 time with Social worker, Explore available resources and support systems, Assess for adequacy in community support network, Educate family and significant other(s) on suicide prevention, Complete Psychosocial Assessment, Interpersonal group therapy.  Evaluation of Outcomes: Progressing   Progress in Treatment: Attending groups: Yes. Participating in groups: Yes. Taking medication as prescribed: Yes. Toleration medication: Yes. Family/Significant other contact made: No, will contact:  Glade Lloyd, patients father Patient understands diagnosis: Yes. Discussing patient identified problems/goals with staff:  Yes. Medical problems stabilized or resolved: Yes. Denies suicidal/homicidal ideation: Yes. Issues/concerns per patient self-inventory: No. Other:  New problem(s) identified: No, Describe:  None  New Short Term/Long Term Goal(s): "To get out, save money and get my traffic tickets paid off."  Patient Goals:  "To get out, save money and get my traffic tickets paid off."  Discharge Plan or Barriers: To return back home and follow up with outpatient treatment.  Reason for Continuation of Hospitalization: Medication stabilization  Estimated Length of Stay: 1-2 days  Attendees: Patient: Scott Velasquez 03/03/2018 11:44 AM  Physician: Dr. Johnella Moloney, MD 03/03/2018 11:44 AM  Nursing: Hulan Amato, RN 03/03/2018 11:44 AM  RN Care Manager: 03/03/2018 11:44 AM  Social Worker: Johny Shears, LCSWA 03/03/2018 11:44 AM  Recreational Therapist: Danella Deis. Dreama Saa, LRT 03/03/2018 11:44 AM  Other: Jake Shark, LSCW 03/03/2018 11:44 AM  Other:  03/03/2018 11:44 AM  Other: 03/03/2018 11:44 AM    Scribe for Treatment Team: Johny Shears, LCSW 03/03/2018 11:44 AM

## 2018-03-03 NOTE — BHH Group Notes (Signed)
B[facility] Group Notes:  (Nursing/MHT/Case Management/Adjunct)  Date:  03/03/2018  Time:  11:22 PM  Type of Therapy:  Group Therapy  Participation Level:  Active  Participation Quality:  Appropriate  Affect:  Appropriate  Cognitive:  Appropriate  Insight:  Appropriate  Engagement in Group:  Engaged  Modes of Intervention:  Discussion  Summary of Progress/Problems:  Burt EkJanice Marie Dinnis Rog 03/03/2018, 11:22 PM

## 2018-03-03 NOTE — BHH Suicide Risk Assessment (Signed)
B[facility] INPATIENT:  Family/Significant Other Suicide Prevention Education  Suicide Prevention Education:  Education Completed; Nedra HaiLee Fouch/dad/[phone] has been identified by the patient as the family member/significant other with whom the patient will be residing, and identified as the person(s) who will aid the patient in the event of a mental health crisis (suicidal ideations/suicide attempt).  With written consent from the patient, the family member/significant other has been provided the following suicide prevention education, prior to the and/or following the discharge of the patient.  The suicide prevention education provided includes the following:  Suicide risk factors  Suicide prevention and interventions  National Suicide Hotline telephone number  [facility] assessment telephone number  [facility] Assistance 911  [facility] and/or Residential Mobile Crisis Unit telephone number  Request made of family/significant other to:  Remove weapons (e.g., guns, rifles, knives), all items previously/currently identified as safety concern.    Remove drugs/medications (over-the-counter, prescriptions, illicit drugs), all items previously/currently identified as a safety concern.  The family member/significant other verbalizes understanding of the suicide prevention education information provided.  The family member/significant other agrees to remove the items of safety concern listed above.  CSW spoke with the patients father regarding the patient and his discharge plan. Nedra HaiLee reports that the patient will be going to his grandmothers house at discharge, He reports that they are going to become more stern with him regarding his medications and aftercare treatment. Father reports that if he doesn't comply with medications and treatment, he will pursue legal guardianship over the patient. He reports the patient having triggers of his mother and girlfriend. He  reports that the grandmother will ensure that the patient makes his appointments and taking his medications properly. He reports that the patient will not have any acces to any guns or weapons once he is discharged.   Scott ShearsCassandra  Damone Velasquez 03/03/2018, 1:50 PM

## 2018-03-03 NOTE — Progress Notes (Signed)
Recreation Therapy Notes  Date: 03/03/2018  Time: 9:30 pm   Location: Craft Room   Behavioral response: N/A   Intervention Topic: Communication  Discussion/Intervention: Patient did not attend group.   Clinical Observations/Feedback:  Patient did not attend group.   Ladeidra Borys LRT/CTRS         Olin Gurski 03/03/2018 11:53 AM

## 2018-03-03 NOTE — Progress Notes (Signed)
[facility] MD Progress Note  03/03/2018 1:59 PM Scott Velasquez  MRN:  [id number] Subjective:  History and events leading up to admission were reviewed with patient. He states that "I got real down real fast." He states that overall he had been doing really well for over a year. He started thinking about his stressors which include "me having schizophrenia and I lost my job." He was ruminating on these things while at a friends house and impulsively took an overdose of Abilify and Haldol. He states, "It was a spur of the moment thing." He denies that this had been something he was planning to do or researching. HE states that after he overdosed he told his friend because he immediately regretted it. He states that he is very happy to be alive now "big time." He does get a bit tearful when talking about the overdose and how he regrets doing it. He states, "I feel happy, carefree." When asked what has changed, he reports that having time to think and process what had happened has been helpful. He states, "I just need to stop and think before I do anything again. I need to think about everything that I have and my daughter."  He denies SI or any thoughts of self harm. He states that he lives with his grandparents and they are very supportive. He has daughter, Scott Velasquez who is 52 months old and she is what keeps him going. He smiles and brightens signficantly when he talks about her. She is living with his mothers currently but he sees her frequently. He denies hearing voices today but does hear voices occassionally. He states that Abilify and Haldol are the only combination of medications that have been helpful for him for the voices. He is organized in thought process. He does not appear depressed, manic or psychotic. He is requesting discharge.   I did speak with his grandmother, Chip Boer. He has been living with her. She states that she has known him since he was born. HE has been staying with her and she enjoys having him  there. She states that he has been doing really well recently. His girlfriend recently broke up with him which she feels was the trigger for this overdose. He was at a friend's house when this happened and the friend called her immediately after the overdose. She confirms that he does have a daughter who he sees frequently. She states that she plans to take control of his medications so this does not happen again. She states that he has had impulsive overdoses in the past. She also states that there are no guns in the home. She is very supportive. She states that she feels safe with him discharging back with her tomorrow and is able to pick him up.   Principal Problem: Schizophrenia (HCC) Diagnosis:   Patient Active Problem List   Diagnosis Date Noted  . Schizophrenia (HCC) [F20.9] 02/26/2018  . 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 Marijuana abuse [F12.10] 02/26/2018  . Suicide attempt (HCC) [T14.91XA] 02/25/2018  . Drug overdose [T50.901A] 02/25/2018   Total Time spent with patient: 30 minutes  Past Psychiatric History: History of schizophrenia which he states was diagnosed when he was 21 due to hearing voices. HE goes to [facility]. He does have previous impulsive overdose attempts.   Past Medical History:  Past Medical History:  Diagnosis Date  . Schizophrenia (HCC)   . Suicide attempt [facility])     Past Surgical History:  Procedure Laterality Date  .  NO PAST SURGERIES     Family History: History reviewed. No pertinent family history. Family Psychiatric  History: See H&P Social History:  Social History   Substance and Sexual Activity  Alcohol Use Not Currently     Social History   Substance and Sexual Activity  Drug Use Yes  . Types: Methamphetamines    Social History   Socioeconomic History  . Marital status: Single    Spouse name: Not on file  . Number of children: Not on file  . Years of education: Not on file  . Highest education level: Not on file   Occupational History  . Not on file  Social Needs  . Financial resource strain: Not on file  . Food insecurity:    Worry: Not on file    Inability: Not on file  . Transportation needs:    Medical: Not on file    Non-medical: Not on file  Tobacco Use  . Smoking status: Current Every Day Smoker    Packs/day: 1.00    Years: 15.00    Pack years: 15.00  . Smokeless tobacco: Never Used  Substance and Sexual Activity  . Alcohol use: Not Currently  . Drug use: Yes    Types: Methamphetamines  . Sexual activity: Not Currently  Lifestyle  . Physical activity:    Days per week: Not on file    Minutes per session: Not on file  . Stress: Not on file  Relationships  . Social connections:    Talks on phone: Not on file    Gets together: Not on file    Attends religious service: Not on file    Active member of club or organization: Not on file    Attends meetings of clubs or organizations: Not on file    Relationship status: Not on file  Other Topics Concern  . Not on file  Social History Narrative  . Not on file   Additional Social History:                         Sleep: Good  Appetite:  Good  Current Medications: Current Facility-Administered Medications  Medication Dose Route Frequency Provider Last Rate Last Dose  . acetaminophen (TYLENOL) tablet 650 mg  650 mg Oral Q6H PRN Aundria Rudakesh, Gopalkumar, MD      . alum & mag hydroxide-simeth (MAALOX/MYLANTA) 200-200-20 MG/5ML suspension 30 mL  30 mL Oral Q4H PRN Aundria Rudakesh, Gopalkumar, MD      . amoxicillin-clavulanate (AUGMENTIN) 875-125 MG per tablet 1 tablet  1 tablet Oral Q12H Aundria Rudakesh, Gopalkumar, MD   1 tablet at 03/03/18 0817  . [START ON 03/04/2018] ARIPiprazole (ABILIFY) tablet 15 mg  15 mg Oral Daily Charisma Charlot R, MD      . divalproex (DEPAKOTE) DR tablet 500 mg  500 mg Oral Q12H Aundria Rudakesh, Gopalkumar, MD   500 mg at 03/03/18 0817  . haloperidol (HALDOL) tablet 10 mg  10 mg Oral BID Avriel Kandel, Ileene HutchinsonHolly R, MD      . hydrOXYzine  (ATARAX/VISTARIL) tablet 25 mg  25 mg Oral Q6H PRN Aundria Rudakesh, Gopalkumar, MD      . magnesium hydroxide (MILK OF MAGNESIA) suspension 30 mL  30 mL Oral Daily PRN Aundria Rudakesh, Gopalkumar, MD      . traZODone (DESYREL) tablet 50 mg  50 mg Oral QHS Aundria Rudakesh, Gopalkumar, MD   50 mg at 03/02/18 2109    Lab Results:  Results for orders placed or performed during the hospital encounter  of 03/01/18 (from the past 48 hour(s))  TSH     Status: None   Collection Time: 03/03/18  6:56 AM  Result Value Ref Range   TSH 1.273 0.350 - 4.500 uIU/mL    Comment: Performed by a 3rd Generation assay with a functional sensitivity of <=0.01 uIU/mL. Performed at [facility], [address] [place], [place] [address]   Lipid panel     Status: Abnormal   Collection Time: 03/03/18  6:56 AM  Result Value Ref Range   Cholesterol 183 0 - 200 mg/dL   Triglycerides 409 <811 mg/dL   HDL 29 (L) >91 mg/dL   Total CHOL/HDL Ratio 6.3 RATIO   VLDL 26 0 - 40 mg/dL   LDL Cholesterol 478 (H) 0 - 99 mg/dL    Comment:        Total Cholesterol/HDL:CHD Risk Coronary Heart Disease Risk Table                     Men   Women  1/2 Average Risk   3.4   3.3  Average Risk       5.0   4.4  2 X Average Risk   9.6   7.1  3 X Average Risk  23.4   11.0        Use the calculated Patient Ratio above and the CHD Risk Table to determine the patient's CHD Risk.        ATP III CLASSIFICATION (LDL):  <100     mg/dL   Optimal  295-621  mg/dL   Near or Above                    Optimal  130-159  mg/dL   Borderline  308-657  mg/dL   High  >846     mg/dL   Very High Performed at [facility], [address] [place], [place] [address]     Blood Alcohol level:  Lab Results  Component Value Date   [facility] <10 02/24/2018    Metabolic Disorder Labs: No results found for: HGBA1C, MPG No results found for: PROLACTIN Lab Results  Component Value Date   CHOL 183 03/03/2018   TRIG 130 03/03/2018   HDL 29 (L) 03/03/2018    CHOLHDL 6.3 03/03/2018   VLDL 26 03/03/2018   LDLCALC 128 (H) 03/03/2018    Physical Findings: AIMS:  , ,  ,  ,    CIWA:    COWS:     Musculoskeletal: Strength & Muscle Tone: within normal limits Gait & Station: normal Patient leans: N/A  Psychiatric Specialty Exam: Physical Exam  Nursing note and vitals reviewed.   Review of Systems  All other systems reviewed and are negative.   Blood pressure 123/79, pulse (!) 114, temperature 97.8 F (36.6 C), temperature source Oral, resp. rate 18, height 6' (1.829 m), weight 103.9 kg, SpO2 96 %.Body mass index is 31.06 kg/m.  General Appearance: Casual  Eye Contact:  Good  Speech:  Clear and Coherent  Volume:  Normal  Mood:  Euthymic  Affect:  Congruent  Thought Process:  Coherent and Goal Directed  Orientation:  Full (Time, Place, and Person)  Thought Content:  Logical  Suicidal Thoughts:  No  Homicidal Thoughts:  No  Memory:  Immediate;   Fair  Judgement:  Impaired  Insight:  Fair  Psychomotor Activity:  Normal  Concentration:  Concentration: Fair  Recall:  [employer] of Knowledge:  Fair  Language:  Fair  Akathisia:  No      Assets:  Resilience  ADL's:  Intact  Cognition:  WNL  Sleep:  Number of Hours: 6.45     Treatment Plan Summary: 27 yo male admitted after serious suicide attempt by overdose. He reports improved mood and denying SI. HE reports this was a very impulsive overdose after a breakup with his girlfriend. HE is future oriented and has his daughter as a protective factor. I did speak with his support who is his grandmother and she plans to take control of his medications.   Plan:  Mood/psychosis -Increase medications back to home doses of Abilify 15 mg daily and continue Haldol 5 mg BID> Will not increase Haldol due to QTc prolongation which has improved. Last was 490 on 9/11 -Continue Depakote 500 mg BID  Acute Bronchitis -will finish course of Augmentin tomorrow  Dispo -Discharge home with  grandparents tomorrow. I spoke with his grandmother who feels safe with this plan.     Haskell Riling, MD 03/03/2018, 1:59 PM

## 2018-03-03 NOTE — Plan of Care (Signed)
Patient rated his depression and anxiety 0/10.Denies SI,HI and AVH.Pleasant and cooperative in the unit.Appropriate with staff & peers.Patient looking forward for discharge tomorrow.Compliant with medications.Attended groups.Appetite and energy level good.Support and encouragement given.

## 2018-03-04 LAB — HEMOGLOBIN A1C
Hgb A1c MFr Bld: 5 % (ref 4.8–5.6)
Mean Plasma Glucose: 97 mg/dL

## 2018-03-04 MED ORDER — ARIPIPRAZOLE 15 MG PO TABS: 15.0000 mg | ORAL_TABLET | Freq: Every day | ORAL | 0 refills | Status: AC

## 2018-03-04 MED ORDER — HALOPERIDOL 5 MG PO TABS: 5.0000 mg | ORAL_TABLET | Freq: Two times a day (BID) | ORAL | 0 refills | Status: AC

## 2018-03-04 MED ORDER — DIVALPROEX SODIUM 500 MG PO DR TAB: 500.0000 mg | DELAYED_RELEASE_TABLET | Freq: Two times a day (BID) | ORAL | 0 refills | Status: AC

## 2018-03-04 MED ORDER — TRAZODONE HCL 100 MG PO TABS: 100.0000 mg | ORAL_TABLET | Freq: Every evening | ORAL | 0 refills | Status: AC | PRN

## 2018-03-04 NOTE — Progress Notes (Signed)
  [facility]  Will you be returning to the same living situation after discharge:  Yes,  To live with grandmother At discharge, do you have transportation home?: Yes,  Grandmother to come at discharge Do you have the ability to pay for your medications: Yes,  Inusrance  Release of information consent forms completed and in the chart;  Patient's signature needed at discharge.  Patient to Follow up at: Follow-up Information    [facility].   Why:  Please follow up on Thursday March 06, 2018 at 1pm. They are needing your new medicare card and proof of household income. Please bring your hospital discharge paperwork and all medications that you are taking. Thank you.  Contact information: [address] [place] [id number] [phone]           Next level of care provider [facility] Link:no  Safety Planning and Suicide Prevention discussed: Yes,  Completed with patients father  Have you used any form of tobacco in the last 30 days? (Cigarettes, Smokeless Tobacco, Cigars, and/or Pipes): Yes  Has patient been referred to the Quitline?: Patient refused referral  Patient has been referred for addiction treatment: Yes  Scott ShearsCassandra  Sallie Staron, LCSW 03/04/2018, 10:42 AM

## 2018-03-04 NOTE — [facility] Group Notes (Signed)
03/04/2018 1PM  Type of Therapy/Topic:  Group Therapy:  Feelings about Diagnosis  Participation Level:  Active   Description of Group:   This group will allow patients to explore their thoughts and feelings about diagnoses they have received. Patients will be guided to explore their level of understanding and acceptance of these diagnoses. Facilitator will encourage patients to process their thoughts and feelings about the reactions of others to their diagnosis and will guide patients in identifying ways to discuss their diagnosis with significant others in their lives. This group will be process-oriented, with patients participating in exploration of their own experiences, giving and receiving support, and processing challenge from other group members.   Therapeutic Goals: 1. Patient will demonstrate understanding of diagnosis as evidenced by identifying two or more symptoms of the disorder 2. Patient will be able to express two feelings regarding the diagnosis 3. Patient will demonstrate their ability to communicate their needs through discussion and/or role play  Summary of Patient Progress: Actively and appropriately engaged in the group. Patient was able to provide support and validation to other group members.Patient practiced active listening when interacting with the facilitator and other group members. Scott CiproBritt spoke about his symptoms of hearing voices. He says "I just don't respond to them out loud. I respond in my head." Patient is still in the process of obtaining treatment goals.        Therapeutic Modalities:   Cognitive Behavioral Therapy Brief Therapy Feelings Identification    Scott ShearsCassandra  Catlin Doria, LCSW 03/04/2018 2:26 PM

## 2018-03-04 NOTE — Plan of Care (Signed)
Pt. Reports Able to remain safe while on the unit, denies SI/Hi. Pt. Verbally contracts for safety. Pt. Complaint with medications.    Problem: Medication: Goal: Compliance with prescribed medication regimen will improve Outcome: Progressing   Problem: Self-Concept: Goal: Ability to disclose and discuss suicidal ideas will improve Outcome: Progressing   Problem: Safety: Goal: Ability to remain free from injury will improve Outcome: Progressing

## 2018-03-04 NOTE — Progress Notes (Signed)
Recreation Therapy Notes  Date: 03/04/2018  Time: 9:30 pm   Location: Craft Room   Behavioral response: N/A   Intervention Topic: Happiness  Discussion/Intervention: Patient did not attend group.   Clinical Observations/Feedback:  Patient did not attend group.   Seab Axel LRT/CTRS        Clemence Stillings 03/04/2018 12:36 PM 

## 2018-03-04 NOTE — Discharge Summary (Signed)
Physician Discharge Summary Note  Patient:  Scott Velasquez is an 27 y.o., male MRN:  [id number] DOB:  1990/07/25 Patient phone:  [phone] (home)  Patient address:   [place] [address],  Total Time spent with patient: 20 minutes  Plus 20 minutes of medication reconciliation, discharge planning, and discharge documentation   Date of Admission:  03/01/2018 Date of Discharge: 03/04/18  Reason for Admission:   Scott Velasquez is a 27 year old Caucasian male with a past history of being diagnosed with schizophrenia, presented to the hospital after intentional overdose.  Was admitted on the medical floor after he developed respiratory failure and needed to be intubated.  Patient extubated 2 days ago.  Was transferred to the psychiatry floor yesterday after being deemed medically stable.  During interview today patient endorses that he overdosed on 50 pills (comprising Abilify and Haldol), endorses this was an impulsive attempt.  Says he felt tearful and sad after he broke up with his girlfriend.  His girlfriend had been cheating on him for the last 2-1/2 years he was with that.  He has had when he talks about this.  Says he has been on Depakote, doxepin, Abilify and Haldol for the last few years and they keep the voices that he hears under control.  When not on medications he hears his cousins voices, does not elucidate what they tell him to do.  Says the voices are really distressing and he would like to get back on his medications to control the voices.  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   Principal Problem: Schizophrenia [name]) Discharge Diagnoses: Patient Active Problem List   Diagnosis Date Noted  . Schizophrenia ([place]) [F20.9] 02/26/2018    Priority: High  . Amphetamine abuse ([place]) [F15.10] 02/26/2018  . Marijuana abuse [F12.10] 02/26/2018  . Suicide attempt ([place]) [T14.91XA] 02/25/2018  . Drug overdose [T50.901A] 02/25/2018    Past Psychiatric  History: see H&P  Past Medical History:  Past Medical History:  Diagnosis Date  . Schizophrenia ([place])   . Suicide attempt [facility])     Past Surgical History:  Procedure Laterality Date  . NO PAST SURGERIES     Family History: History reviewed. No pertinent family history. Family Psychiatric  History: See H&P Social History:  Social History   Substance and Sexual Activity  Alcohol Use Not Currently     Social History   Substance and Sexual Activity  Drug Use Yes  . Types: Methamphetamines    Social History   Socioeconomic History  . Marital status: Single    Spouse name: Not on file  . Number of children: Not on file  . Years of education: Not on file  . Highest education level: Not on file  Occupational History  . Not on file  Social Needs  . Financial resource strain: Not on file  . Food insecurity:    Worry: Not on file    Inability: Not on file  . Transportation needs:    Medical: Not on file    Non-medical: Not on file  Tobacco Use  . Smoking status: Current Every Day Smoker    Packs/day: 1.00    Years: 15.00    Pack years: 15.00  . Smokeless tobacco: Never Used  Substance and Sexual Activity  . Alcohol use: Not Currently  . Drug use: Yes    Types: Methamphetamines  . Sexual activity: Not Currently  Lifestyle  . Physical activity:    Days per week: Not  on file    Minutes per session: Not on file  . Stress: Not on file  Relationships  . Social connections:    Talks on phone: Not on file    Gets together: Not on file    Attends religious service: Not on file    Active member of club or organization: Not on file    Attends meetings of clubs or organizations: Not on file    Relationship status: Not on file  Other Topics Concern  . Not on file  Social History Narrative  . Not on file    Hospital Course:  Pt was restarted on home medications of Abilify and Haldol. Haldol was lowered to 5 mg BID due to QTc prolongation while on the medical floor. He  was not restarted on doxepin due to QTc prolongation and also lethality in overdose. He has history of multiple impulsive overdose attempts. This was improving but still needs to be monitored. QTc on 9/11 was 491. He did not have any evidence of psychosis while on the unit. HE consistently adamantly denied SI or any thoughts of self harm. On day of discharge, he reported feeling good. He denied feeling depressed or sad. Denied SI or any thoughts of self harm. He was very excited to see his daughter. He brightens up significantly when talking about his daughter. I spoke with his grandmother yesterday who is going to take control of his medications to mitigate the risk of another impulsive overdose. Pt was organized in thoughts. He did not appear manic, psychotic or depressed. He requested discharge and no longer met IVC criteria.   The patient is at low risk of imminent suicide. Patient denied thoughts, intent, or plan for harm to self or others, expressed significant future orientation, and expressed an ability to mobilize assistance for his needs. he is presently void of any contributing psychiatric symptoms, cognitive difficulties, or substance use which would elevate his risk for lethality. Chronic risk for lethality is elevated in light of impulsive overdose attempts in response to stress, he does exhibit some cluster b personality traits. The chronic risk is presently mitigated by his ongoing desire and engagement in [facility] treatment and mobilization of support from family and friends. Chronic risk may elevate if he experiences any significant loss or worsening of symptoms, which can be managed and monitored through outpatient providers. At this time,a cute risk for lethality is low and he is stable for ongoing outpatient management.   Modifiable risk factors were addressed during this hospitalization through appropriate pharmacotherapy and establishment of outpatient follow-up treatment. Some risk factors for  suicide are situational (i.e. Unstable housing) or related personality pathology (i.e. Poor coping mechanisms) and thus cannot be further mitigated by continued hospitalization in this setting.    Physical Findings: AIMS:  , ,  ,  ,    CIWA:    COWS:     Musculoskeletal: Strength & Muscle Tone: within normal limits Gait & Station: normal Patient leans: N/A  Psychiatric Specialty Exam: Physical Exam  Nursing note and vitals reviewed.   Review of Systems  All other systems reviewed and are negative.   Blood pressure 123/80, pulse 95, temperature 97.9 F (36.6 C), temperature source Oral, resp. rate 16, height 6' (1.829 m), weight 103.9 kg, SpO2 95 %.Body mass index is 31.06 kg/m.  General Appearance: Casual  Eye Contact:  Good  Speech:  Clear and Coherent  Volume:  Normal  Mood:  Euthymic  Affect:  Congruent  Thought Process:  Coherent and Goal Directed  Orientation:  Full (Time, Place, and Person)  Thought Content:  Logical  Suicidal Thoughts:  No  Homicidal Thoughts:  No  Memory:  Immediate;   Fair  Judgement:  Impaired  Insight:  Lacking  Psychomotor Activity:  Normal  Concentration:  Concentration: Fair  Recall:  [place] of Knowledge:  Fair  Language:  Fair  Akathisia:  No      Assets:  Resilience  ADL's:  Intact  Cognition:  WNL  Sleep:  Number of Hours: 5.45     Have you used any form of tobacco in the last 30 days? (Cigarettes, Smokeless Tobacco, Cigars, and/or Pipes): Yes  Has this patient used any form of tobacco in the last 30 days? (Cigarettes, Smokeless Tobacco, Cigars, and/or Pipes) Yes, Yes, A prescription for an FDA-approved tobacco cessation medication was offered at discharge and the patient refused  Blood Alcohol level:  Lab Results  Component Value Date   ETH <10 21/08/1279    Metabolic Disorder Labs:  Lab Results  Component Value Date   HGBA1C 5.0 03/03/2018   MPG 97 03/03/2018   No results found for: PROLACTIN Lab Results   Component Value Date   CHOL 183 03/03/2018   TRIG 130 03/03/2018   HDL 29 (L) 03/03/2018   CHOLHDL 6.3 03/03/2018   VLDL 26 03/03/2018   LDLCALC 128 (H) 03/03/2018    See Psychiatric Specialty Exam and Suicide Risk Assessment completed by Attending Physician prior to discharge.  Discharge destination:  Home  Is patient on multiple antipsychotic therapies at discharge:  Yes,   Do you recommend tapering to monotherapy for antipsychotics?  Yes   Has Patient had three or more failed trials of antipsychotic monotherapy by history:  Yes  Recommended Plan for Multiple Antipsychotic Therapies: Consider tapering off Haldol and maximizing Abilify. He reports that this combination has been the only thing helpful for him. Outpatient psychiatrist can consider this option  Discharge Instructions    Increase activity slowly   Complete by:  As directed      Allergies as of 03/04/2018   No Known Allergies     Medication List    STOP taking these medications   amoxicillin-clavulanate 875-125 MG tablet Commonly known as:  AUGMENTIN   feeding supplement (ENSURE ENLIVE) Liqd     TAKE these medications     Indication  ARIPiprazole 15 MG tablet Commonly known as:  ABILIFY Take 1 tablet (15 mg total) by mouth daily. Start taking on:  03/05/2018  Indication:  psychosis   divalproex 500 MG DR tablet Commonly known as:  DEPAKOTE Take 1 tablet (500 mg total) by mouth every 12 (twelve) hours.  Indication:  Mood stabilizer   haloperidol 5 MG tablet Commonly known as:  HALDOL Take 1 tablet (5 mg total) by mouth 2 (two) times daily.  Indication:  Psychosis   traZODone 100 MG tablet Commonly known as:  DESYREL Take 1 tablet (100 mg total) by mouth at bedtime as needed for sleep.  Indication:  [place]. Go on 03/06/2018.   Why:  Please follow up on Thursday March 06, 2018 at 1pm. They are needing your new medicare  card and proof of household income. Please bring your hospital discharge paperwork and all medications that you are taking. Thank you.  Contact information: [place] [address] [phone]  Signed: Marylin Crosby, MD 03/04/2018, 9:13 AM

## 2018-03-04 NOTE — [facility] Suicide Risk Assessment (Signed)
[facility]   Principal Problem: Schizophrenia [facility](HCC) Discharge Diagnoses:  Patient Active Problem List   Diagnosis Date Noted  . Schizophrenia ([facility]) [F20.9] 02/26/2018    Priority: High  . 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 Marijuana abuse [F12.10] 02/26/2018  . Suicide attempt ([facility]) [T14.91XA] 02/25/2018  . Drug overdose [T50.901A] 02/25/2018      Mental Status Per Nursing Assessment::   On Admission:  Suicidal ideation indicated by patient  Demographic Factors:  Male, Caucasian and Unemployed  Loss Factors: Decrease in vocational status and Loss of significant relationship  Historical Factors: Prior suicide attempts and Impulsivity  Risk Reduction Factors:   Responsible for children under 26 years of age, Sense of responsibility to family, Living with another person, especially a relative, Positive social support, Positive therapeutic relationship and Positive coping skills or problem solving skills  Continued Clinical Symptoms:  Previous Psychiatric Diagnoses and Treatments  Cognitive Features That Contribute To Risk:  None    Suicide Risk:  Minimal Acute Risk: No identifiable suicidal ideation.Chronic Elevated risk.  [facility]   Why:  Please follow up on Thursday March 06, 2018 at 1pm. They are needing your new medicare card and proof of household income. Please bring your hospital discharge paperwork and all medications that you are taking. Thank you.  Contact information: [address] [place] [address] [phone]            Haskell RilingHolly R Alyra Patty, MD 03/04/2018, 9:12 AM

## 2018-03-04 NOTE — Progress Notes (Signed)
Patient alert and oriented x 4. Ambulates unit with steady gait. Verbally denies SI/HI/AVH and pain. Patient discharged on above date and time. Verbalized understanding the discharge information provided to patient upon discharge. Patient departed unit with discharge paperwork, prescriptions and personal belongings. Picked up by family and plans to follow up with [facility] .

## 2018-03-04 NOTE — Progress Notes (Signed)
D: Pt denies SI/HI/AVH, verbally contracts for safety. Pt is pleasant and cooperative. Pt. Complains of not being able to sleep, given trazodone PRN with call placed to MD.  Patient Interaction appropriate, but forwards little. Pt. Denies breathing problems, respirations unlabored and even. Pt. Denies depression and anxiety this evening. Pt. Visible frequently in the milieu. Attends snacks and groups appropriately.   A: Q x 15 minute observation checks were completed for safety. Patient was provided with education, but needs reinforcement.  Patient was given/offered medications per orders. Patient  was encourage to attend groups, participate in unit activities and continue with plan of care. Pt. Chart and plans of care reviewed. Pt. Given support and encouragement.   R: Patient is complaint with medication and unit procedures.             Precautionary checks every 15 minutes for safety maintained, room free of safety hazards, patient sustains no injury or falls during this shift. Will endorse care to next shift.

## 2018-03-27 DIAGNOSIS — Z79899 Other long term (current) drug therapy: Secondary | ICD-10-CM | POA: Diagnosis not present

## 2018-05-14 DIAGNOSIS — R05 Cough: Secondary | ICD-10-CM | POA: Diagnosis not present

## 2018-05-14 DIAGNOSIS — R0981 Nasal congestion: Secondary | ICD-10-CM | POA: Diagnosis not present

## 2018-05-14 DIAGNOSIS — Z720 Tobacco use: Secondary | ICD-10-CM | POA: Diagnosis not present

## 2018-07-20 DIAGNOSIS — Z79899 Other long term (current) drug therapy: Secondary | ICD-10-CM | POA: Diagnosis not present

## 2018-08-17 DIAGNOSIS — 419620001 Death: Secondary | SNOMED CT | POA: Diagnosis not present

## 2018-08-17 DEATH — deceased

## 2020-08-16 IMAGING — DX DG CHEST 1V PORT
1 series · 1 of 1 positions shown · non-contrast
Comparison: None.

CLINICAL DATA: Cough and short of breath overdose

EXAM:
PORTABLE CHEST 1 VIEW

[chest ap]
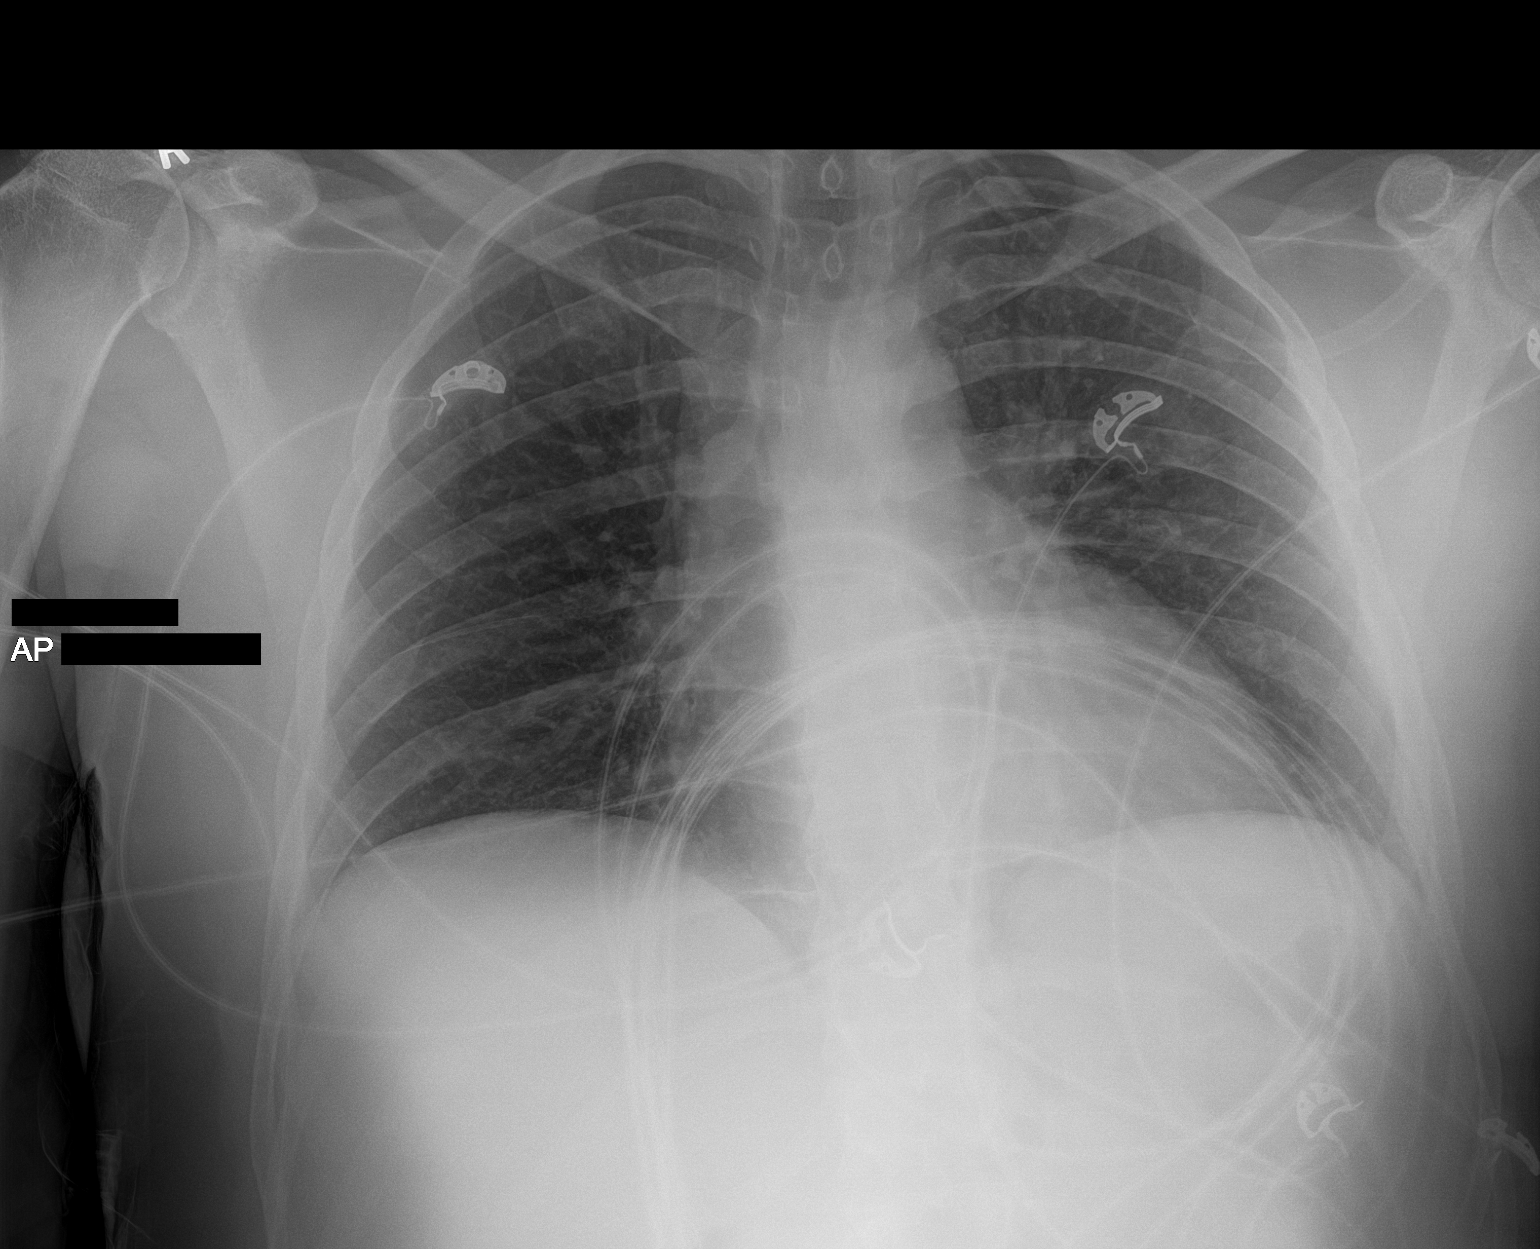

[1 of 1 positions shown; findings below may reference images not displayed]

FINDINGS: Borderline heart size. No acute opacity or pleural effusion. No
pneumothorax.
IMPRESSION: No active disease.

## 2020-08-19 IMAGING — CR DG CHEST 2V
2 series · 2 of 2 positions shown · non-contrast
Comparison: 02/26/2018

CLINICAL DATA: Shortness of breath, productive cough

EXAM:
CHEST - 2 VIEW

[chest lat]
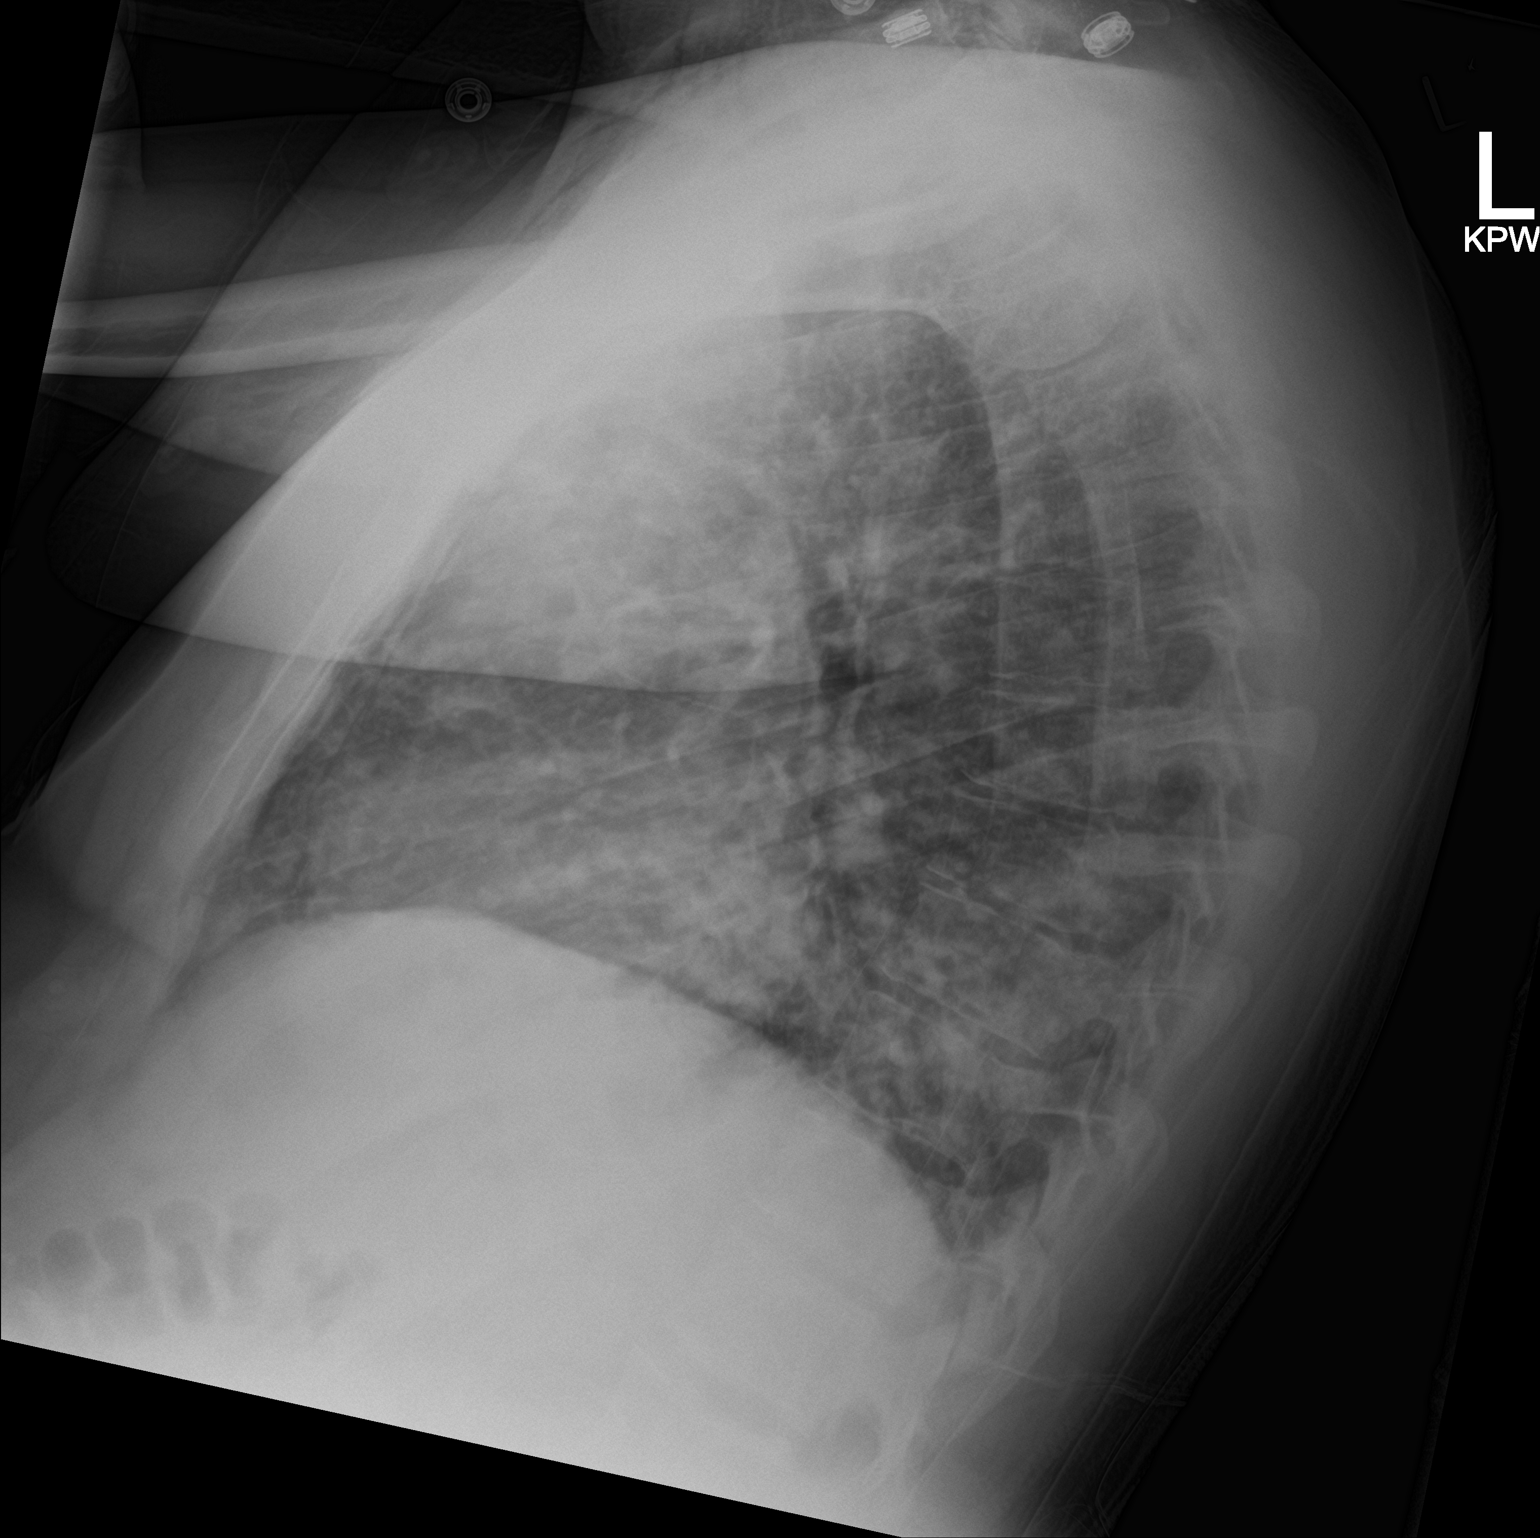

[chest ap]
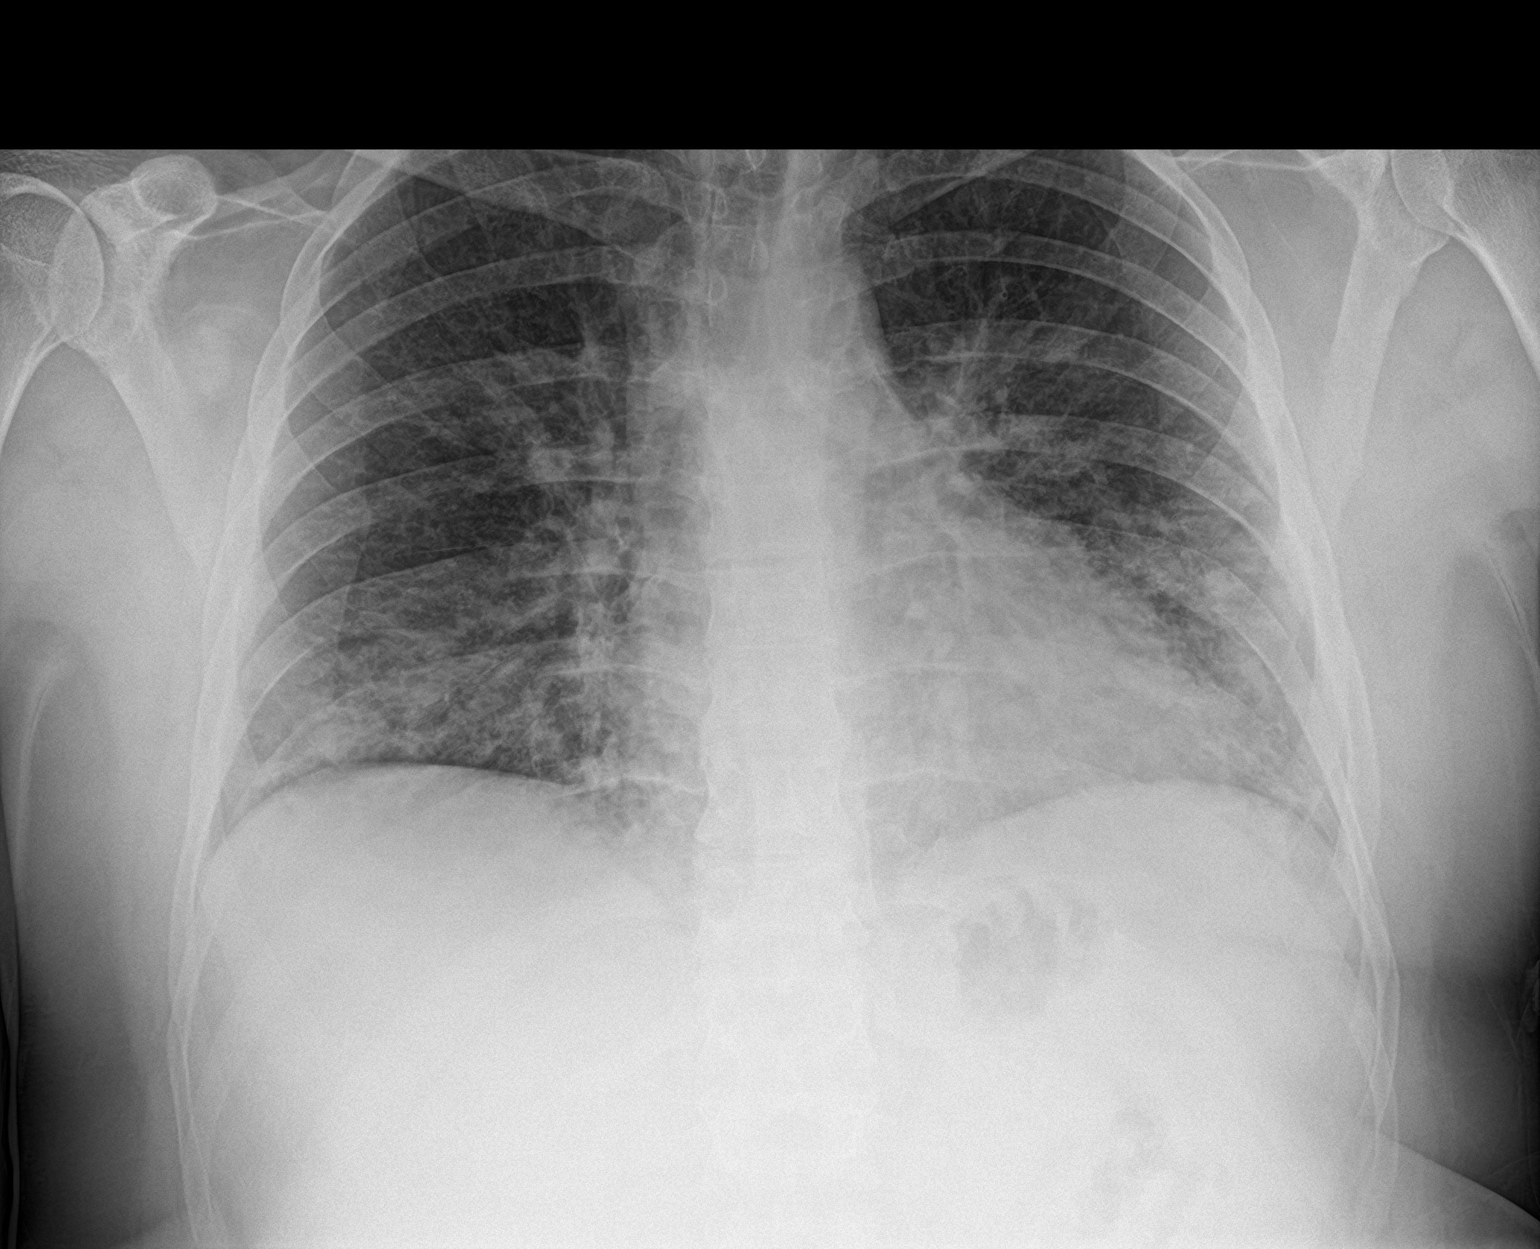

[2 of 2 positions shown; findings below may reference images not displayed]

FINDINGS: Cardiomegaly. Diffuse interstitial prominence throughout the lungs,
most pronounced in the lower lungs. No effusions. No acute bony
abnormality.
IMPRESSION: Diffuse interstitial prominence, most confluent in the lower lobes
with mild cardiomegaly. This could represent edema or atypical
infection.
